# Patient Record
Sex: Male | Born: 1955
Health system: Southern US, Community
[De-identification: ages and names within clinical notes are randomized; demographics above are authoritative.]

---

## 2003-01-08 ENCOUNTER — Encounter: Admission: RE | Admit: 2003-01-08 | Discharge: 2003-01-08 | Payer: Self-pay | Admitting: Neurosurgery

## 2003-02-08 ENCOUNTER — Ambulatory Visit (HOSPITAL_COMMUNITY): Admission: RE | Admit: 2003-02-08 | Discharge: 2003-02-08 | Payer: Self-pay | Admitting: Neurosurgery

## 2003-03-12 ENCOUNTER — Ambulatory Visit (HOSPITAL_COMMUNITY): Admission: RE | Admit: 2003-03-12 | Discharge: 2003-03-12 | Payer: Self-pay | Admitting: Neurosurgery

## 2003-07-18 ENCOUNTER — Ambulatory Visit (HOSPITAL_COMMUNITY): Admission: RE | Admit: 2003-07-18 | Discharge: 2003-07-19 | Payer: Self-pay | Admitting: Neurosurgery

## 2003-11-26 ENCOUNTER — Encounter: Admission: RE | Admit: 2003-11-26 | Discharge: 2003-11-26 | Payer: Self-pay | Admitting: Neurosurgery

## 2003-12-25 ENCOUNTER — Encounter: Admission: RE | Admit: 2003-12-25 | Discharge: 2003-12-25 | Payer: Self-pay | Admitting: Neurosurgery

## 2004-01-16 ENCOUNTER — Encounter: Admission: RE | Admit: 2004-01-16 | Discharge: 2004-01-16 | Payer: Self-pay | Admitting: Neurosurgery

## 2004-04-23 ENCOUNTER — Encounter: Admission: RE | Admit: 2004-04-23 | Discharge: 2004-04-23 | Payer: Self-pay | Admitting: Neurosurgery

## 2004-04-27 ENCOUNTER — Encounter: Admission: RE | Admit: 2004-04-27 | Discharge: 2004-04-27 | Payer: Self-pay | Admitting: Neurosurgery

## 2004-05-27 ENCOUNTER — Encounter: Admission: RE | Admit: 2004-05-27 | Discharge: 2004-05-27 | Payer: Self-pay | Admitting: Neurosurgery

## 2004-06-30 ENCOUNTER — Inpatient Hospital Stay (HOSPITAL_COMMUNITY): Admission: RE | Admit: 2004-06-30 | Discharge: 2004-07-07 | Payer: Self-pay | Admitting: Neurosurgery

## 2004-07-29 ENCOUNTER — Ambulatory Visit (HOSPITAL_COMMUNITY): Admission: RE | Admit: 2004-07-29 | Discharge: 2004-07-29 | Payer: Self-pay | Admitting: Neurosurgery

## 2005-04-08 ENCOUNTER — Encounter: Admission: RE | Admit: 2005-04-08 | Discharge: 2005-04-08 | Payer: Self-pay | Admitting: Neurosurgery

## 2006-05-03 ENCOUNTER — Ambulatory Visit: Payer: Self-pay | Admitting: Oncology

## 2006-05-17 ENCOUNTER — Encounter: Payer: Self-pay | Admitting: Infectious Disease

## 2006-05-17 ENCOUNTER — Ambulatory Visit: Payer: Self-pay | Admitting: Oncology

## 2006-12-22 ENCOUNTER — Encounter: Admission: RE | Admit: 2006-12-22 | Discharge: 2006-12-22 | Payer: Self-pay | Admitting: Neurosurgery

## 2007-03-06 ENCOUNTER — Ambulatory Visit: Payer: Self-pay | Admitting: Infectious Disease

## 2007-03-06 DIAGNOSIS — B370 Candidal stomatitis: Secondary | ICD-10-CM | POA: Insufficient documentation

## 2007-03-06 DIAGNOSIS — R1310 Dysphagia, unspecified: Secondary | ICD-10-CM | POA: Insufficient documentation

## 2007-03-06 DIAGNOSIS — Z9889 Other specified postprocedural states: Secondary | ICD-10-CM | POA: Insufficient documentation

## 2007-03-06 DIAGNOSIS — D72829 Elevated white blood cell count, unspecified: Secondary | ICD-10-CM | POA: Insufficient documentation

## 2007-03-06 LAB — CONVERTED CEMR LAB
ALT: 35 units/L (ref 0–53)
AST: 20 units/L (ref 0–37)
Alkaline Phosphatase: 95 units/L (ref 39–117)
BUN: 15 mg/dL (ref 6–23)
Basophils Absolute: 0.1 10*3/uL (ref 0.0–0.1)
Basophils Relative: 1 % (ref 0–1)
CRP: 0.6 mg/dL — ABNORMAL HIGH (ref <0.6)
Calcium: 10 mg/dL (ref 8.4–10.5)
Creatinine, Ser: 1.13 mg/dL (ref 0.40–1.50)
Eosinophils Absolute: 0.2 10*3/uL (ref 0.0–0.7)
Eosinophils Relative: 1 % (ref 0–5)
Ferritin: 365 ng/mL — ABNORMAL HIGH (ref 22–322)
Iron: 101 ug/dL (ref 42–165)
MCHC: 34.1 g/dL (ref 30.0–36.0)
MCV: 88.4 fL (ref 78.0–100.0)
Monocytes Relative: 5 % (ref 3–12)
Neutrophils Relative %: 69 % (ref 43–77)
Potassium: 4.6 meq/L (ref 3.5–5.3)
RDW: 12.9 % (ref 11.5–15.5)
TIBC: 358 ug/dL (ref 215–435)
TSH: 1.231 microintl units/mL (ref 0.350–5.50)
Total Bilirubin: 0.7 mg/dL (ref 0.3–1.2)
Vitamin B-12: 424 pg/mL (ref 211–911)

## 2007-03-13 ENCOUNTER — Telehealth: Payer: Self-pay

## 2007-05-08 ENCOUNTER — Ambulatory Visit: Payer: Self-pay | Admitting: Infectious Disease

## 2007-05-08 DIAGNOSIS — R221 Localized swelling, mass and lump, neck: Secondary | ICD-10-CM

## 2007-05-08 DIAGNOSIS — R220 Localized swelling, mass and lump, head: Secondary | ICD-10-CM

## 2007-07-25 ENCOUNTER — Encounter: Admission: RE | Admit: 2007-07-25 | Discharge: 2007-07-25 | Payer: Self-pay | Admitting: Neurosurgery

## 2009-11-16 IMAGING — CT CT L SPINE W/O CM
4 of 10 series · 12 of 33 positions shown, 14 images · non-contrast
Comparison: MRI 12/22/2006.

CLINICAL DATA: Back and bilateral leg pain

CT LUMBAR SPINE WITHOUT CONTRAST
TECHNIQUE: Multidetector CT imaging of the lumbar spine was
performed without intravenous contrast administration. Multiplanar
CT image reconstructions were also generated.

[Series 2: l-spine helical · axial · 0.27mm/px · z∈[+20,+97]mm · 2 of 94 slices shown, 3 images]
[im 32/94  soft-tissue]
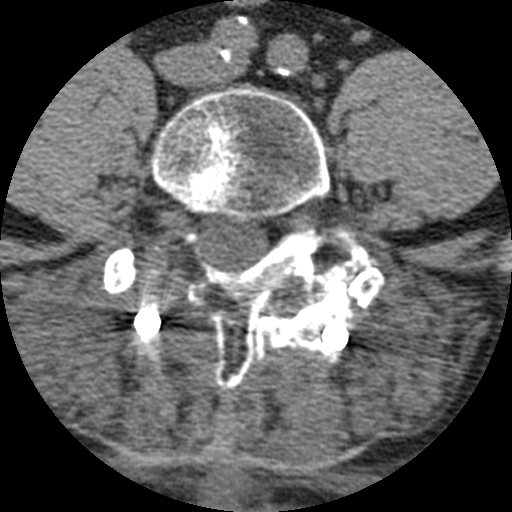
[im 32/94  bone]
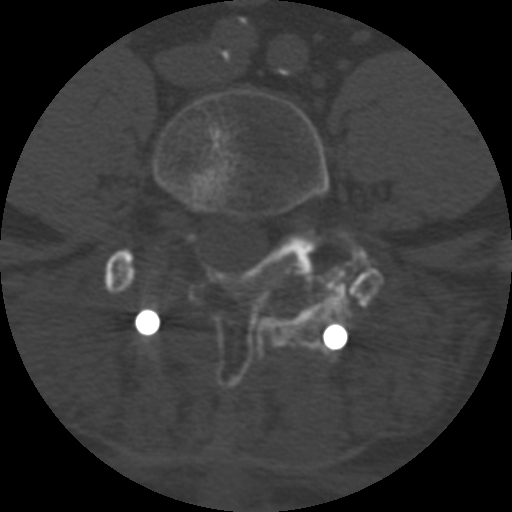
[im 63/94  bone]
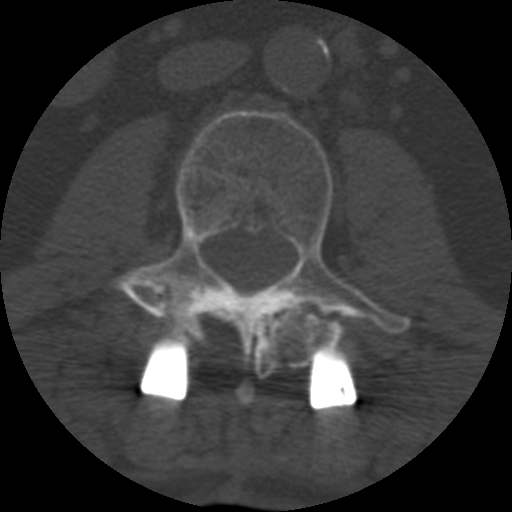

[Series 3: bone windows · axial · 0.27mm/px · z∈[+20,+97]mm · 2 of 94 slices shown]
[im 32/94  bone]
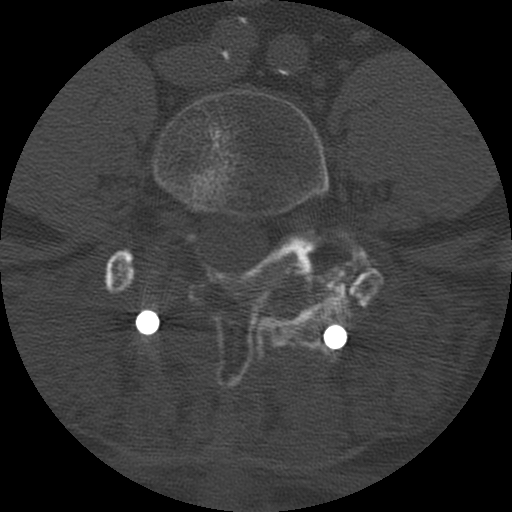
[im 63/94  bone]
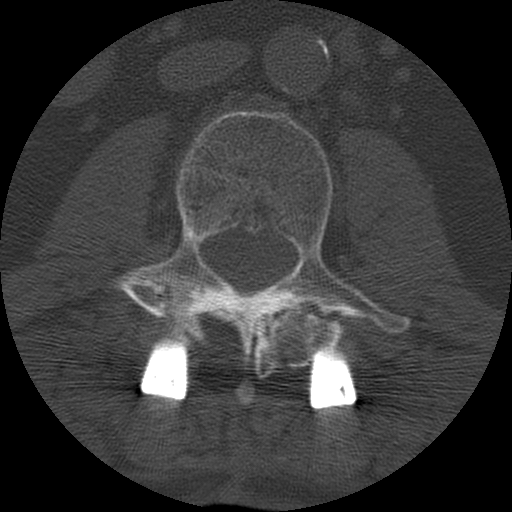

[Series 400: coronal · coronal · 0.46mm/px · 5 of 40 slices shown, 6 images]
[im 14/40  bone]
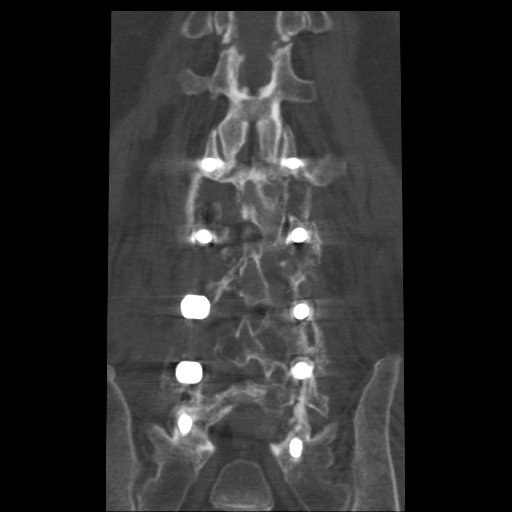
[im 17/40  bone]
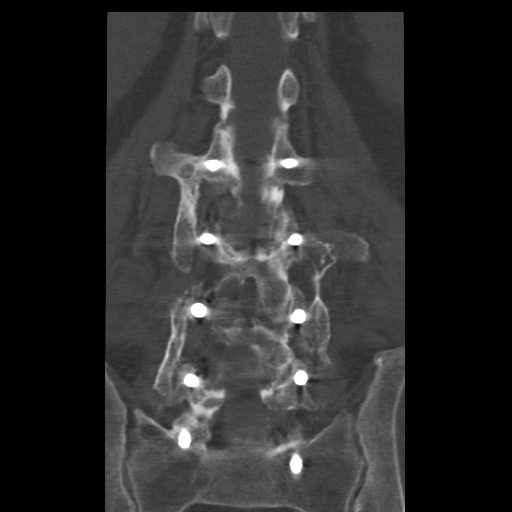
[im 20/40  soft-tissue]
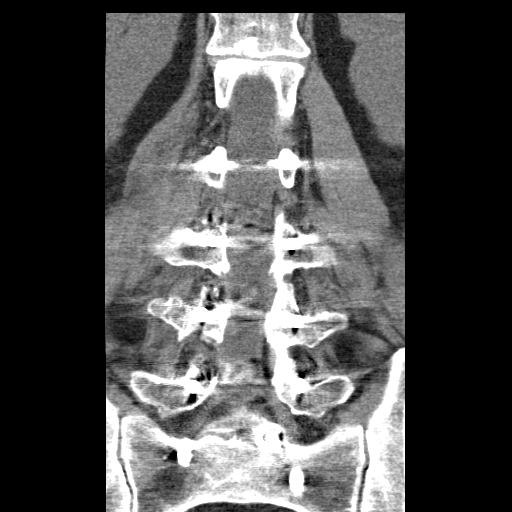
[im 20/40  bone]
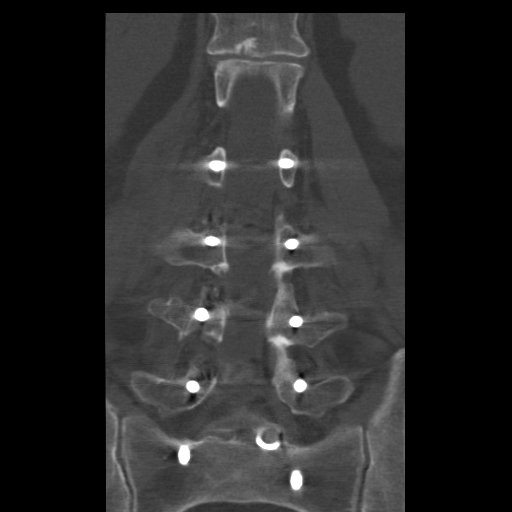
[im 23/40  bone]
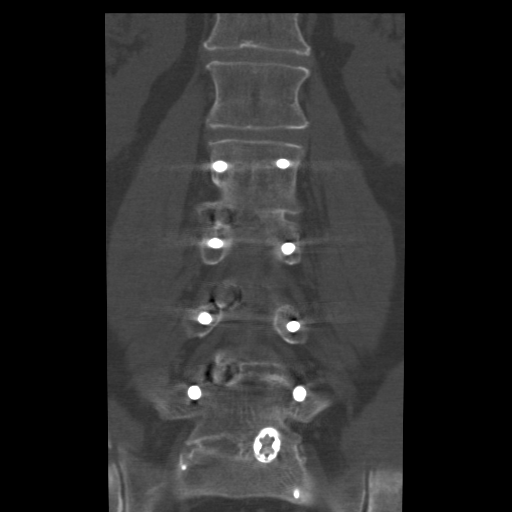
[im 27/40  bone]
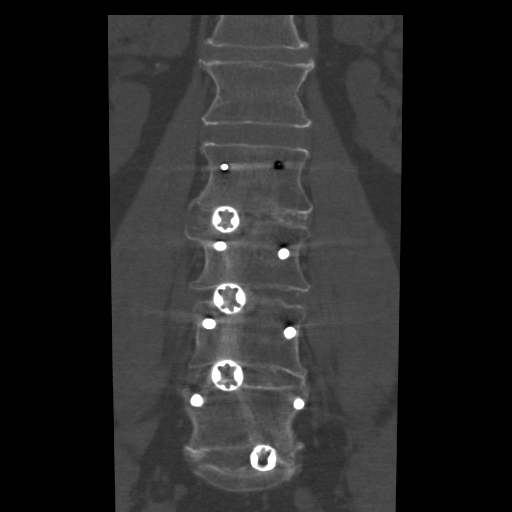

[Series 401: sagittal · sagittal · 0.46mm/px · 3 of 37 slices shown]
[im 8/37  bone]
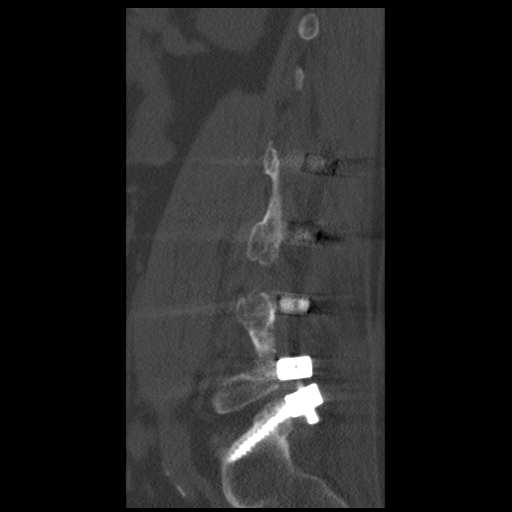
[im 15/37  bone]
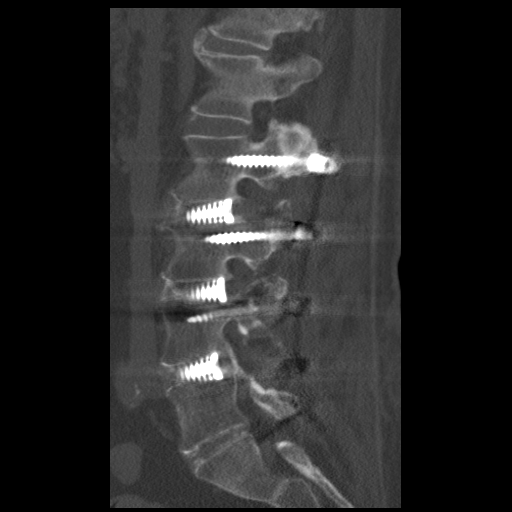
[im 22/37  bone]
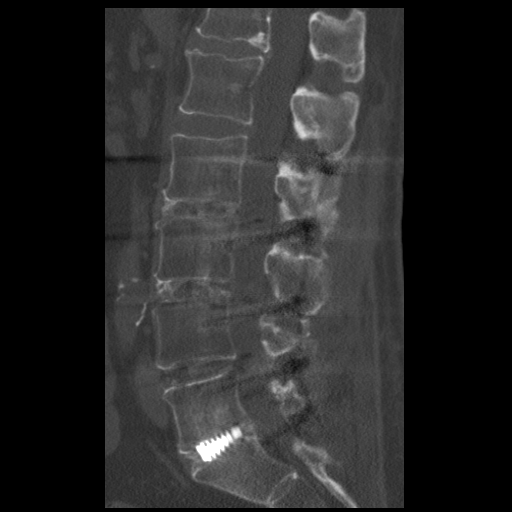

[12 of 33 positions shown; findings below may reference images not displayed]

FINDINGS: There is a solid fusion from L2-S1 with instrumentation
using pedicle screw and rod fixation as well as threaded interbody
cages anteriorly.  At L1-2 there is a shallow central and rightward
protrusion which potentially could irritate the right L2 nerve
root.  Posterior elements are fused, right more so than left, and
there is likely little movement at this level.

At T12-L1 there is a shallow calcific protrusion extending slightly
more to the right than the left.  It is unclear if this causes in
the L1 nerve root irritation.  There is no spinal stenosis.  These
areas can be seen fairly well on prior MRI and appear essentially
unchanged.  The only feature which is not well evaluated on MRI is
bony fusion, and this appears satisfactory on today's CT.

Hardware grossly intact.  No malposition or fractures seen. Mild
nonaneurysmal atherosclerotic calcification of the aorta is noted.
No adenopathy or masses can be seen
IMPRESSION: Shallow protrusions at T12-L1, and L1-2, as described.  Neither of
these appear compressive bone potentially the right L2 nerve root
could be irritated.

Compared with prior MR from December 2006 there is no significant
change.

Satisfactory appearance status post L2-S1 fusion

## 2010-02-28 ENCOUNTER — Encounter: Payer: Self-pay | Admitting: Neurosurgery

## 2010-03-01 ENCOUNTER — Encounter: Payer: Self-pay | Admitting: Neurosurgery

## 2010-06-26 NOTE — Op Note (Signed)
NAMEJUVENAL, UMAR                             ACCOUNT NO.:  [id number]   MEDICAL RECORD NO.:  [id number]                   PATIENT TYPE:  OIB   LOCATION:  3009                                 FACILITY:  [facility]   PHYSICIAN:  Reinaldo Meeker, M.D.              DATE OF BIRTH:  Oct 03, 1955   DATE OF PROCEDURE:  03/12/2003  DATE OF DISCHARGE:  03/12/2003                                 OPERATIVE REPORT   PREOPERATIVE DIAGNOSIS:  Herniated disc L2-L3 right.   POSTOPERATIVE DIAGNOSIS:  Herniated disc L2-L3 right.   PROCEDURE:  Right L2-L3 intralaminar laminotomy for excision of herniated  disc with operating microscope.   SECONDARY PROCEDURE:  Microdissection L2-L3 disc and L3 nerve root.   SURGEON:  Reinaldo Meeker, M.D.   ASSISTANT:  Kathaleen Maser. Pool, M.D.   PROCEDURE IN DETAIL:  After being placed in the prone position, the  patient's back was prepped and draped in the usual sterile fashion.  Localizing x-ray was taken prior to incision to identify the appropriate  level.  A midline incision was made above the spinous processes of L2 and  L3.  Using Bovie cutting current, the incision was carried to the spinous  processes.  Subperiosteal dissection was then carried out along the spinous  processes and lamina.  Self-retaining retractor was placed for exposure.  A  second x-ray showed we approached the appropriate level.  Using the high  speed drill, the inferior 1/2 of the L2 lamina and the medial 1/3 of the  facet joint was removed.  The drill was then used to remove the superior 1/2  of the L3 lamina.  Residual bone and ligamentum flavum was removed in a  piecemeal fashion.  The microscope was draped, brought into the field, and  used for the remainder of the case.  Using microdissection technique, the  lateral aspect of the thecal sac and L3 nerve root were identified.  Further  coagulation was carried down to the floor of the canal to identify the L2-L3  disc which was found to be  herniated in the lateral aspect of the spinal  canal.  After coagulating on the annulus, the annulus was incised with a 15  blade.  Using pituitary rongeurs and curets, thorough disc space clean out  was carried out.  Care was taken to avoid injury to the neural elements and  this was successfully done.  At this point, inspection was carried out in  all directions with no evidence of residual compression.  Large amounts of  irrigation were carried out, bleeding was controlled with bipolar  coagulation and Gelfoam.  The wound was then closed using interrupted Vicryl  in the muscle, fascia, subcutaneous and subcuticular tissue, and staples on  the skin.  Sterile dressings were then applied and the patient was extubated  and taken to the recovery room in stable condition.  Reinaldo Meeker, M.D.    ROK/MEDQ  D:  03/12/2003  T:  03/12/2003  Job:  [id number]

## 2010-06-26 NOTE — Op Note (Signed)
Derek Caldwell, Derek Caldwell                           ACCOUNT NO.:  [id number]   MEDICAL RECORD NO.:  [id number]                   PATIENT TYPE:  OIB   LOCATION:  3011                                 FACILITY:  [facility]   PHYSICIAN:  Reinaldo Meeker, M.D.              DATE OF BIRTH:  04-Jan-1956   DATE OF PROCEDURE:  07/18/2003  DATE OF DISCHARGE:                                 OPERATIVE REPORT   PREOPERATIVE DIAGNOSIS:  Herniated disk L2-3, right, recurrent.   POSTOPERATIVE DIAGNOSIS:  Herniated disk L2-3, right, recurrent.   PROCEDURE:  Right L2-3 re-do micro diskectomy.   SURGEON:  Reinaldo Meeker, M.D.   ASSISTANT:  Tia Alert, M.D.   DESCRIPTION OF PROCEDURE:  After being placed in the prone position, the  patient's back was prepped and draped in the usual sterile fashion.  A  localizing x-ray was taken prior to incision to identify the appropriate  level.  Previous lumbar incision was opened and both the spinous processes  of L2 and L3.  Using Bovie cutting current, the incision was carried up the  spinous processes.  Subperiosteal dissection was then carried out on the  right side of the spinous processes, lamina, and facet joints.  A self-  retaining retractor was placed for exposure.  __________  the appropriate  level.  Edges of the previous laminotomy were identified and the laminotomy  was enlarged.  Scar tissue on the thecal sac was removed.  L3 nerve root was  easily identified as it passed around the pedicle of L3.  At this time the  microscope was draped and brought into the field and used throughout the  remainder of the case.  Starting at the level of the pedicle, the nerve root  was dissected medially.  Dissection was then carried out superiorly towards  the L2-3 disk which was found markedly herniated.  After __________  incised  with a 15 blade.  Using pituitary rongeurs and curets, thorough disk space  clean-out was carried out.  Calcified fragment of disk  material was also  removed from the lateral aspect of the thecal sac.  Thorough disk space  clean-out was carried out.  At the same time great care was taken to avoid  injury to the __________  .  This was successfully done.  At this point  inspection was carried out in all directions without any evidence of  residual compression could be identified.  Large amounts of irrigation were  carried out and bleeding controlled by coagulation and Gelfoam.  The wound  was then closed using interrupted Vicryl in the muscle, fascia,  subcutaneous, subcuticular tissues, and staples on the skin.  A sterile  dressing was then applied and the patient was extubated and taken to the  recovery room in stable condition.  Reinaldo Meeker, M.D.    ROK/MEDQ  D:  07/18/2003  T:  07/18/2003  Job:  [id number]

## 2010-06-26 NOTE — H&P (Signed)
Derek Caldwell, Derek Caldwell                 ACCOUNT NO.:  [id number]   MEDICAL RECORD NO.:  [id number]          PATIENT TYPE:  INP   LOCATION:  2899                         FACILITY:  [facility]   PHYSICIAN:  Payton Doughty, M.D.      DATE OF BIRTH:  1956/01/15   DATE OF ADMISSION:  06/30/2004  DATE OF DISCHARGE:                                HISTORY & PHYSICAL   ADMITTING DIAGNOSIS:  Lumbar spondylosis.   HISTORY OF PRESENT ILLNESS:  This is a very nice 55 year old right-handed  white gentleman who one year ago February underwent a 2-3 diskectomy on the  right side by another neurosurgeon.  He had persistent pain and underwent a  redo operation in June.  He has had persistent pain in the right side.  He  was set up for epidurals.  MR in September demonstrated remaining disk  foramen with obstruction on the right side, and laterally protruding  osteophyte off the superior aspect of L3 that was encumbering the 2 or 3  root.  Considering an operation, managing conservatively, and then developed  the onset of left lower extremity pain.  Repeat MR approximately six weeks  ago demonstrated a disk on the left side at L5-S1, the site of a prior  operation in 1992, in addition to progression of disease at L4-5 on the  right side, spondylosis at 3-4, and bad spondylosis at 2-3 with obstruction  of the right-sided roots.  He is now admitted for decompression and fusion  of the right side at 2-3, 3-4, and 4-5, and on the left side 5-1 fusion  across those levels.   PAST MEDICAL HISTORY:  Remarkable for coronary artery disease.   MEDICATIONS:  1.  Zocor 20 mg daily.  2.  Norvasc 10 mg daily.  3.  Doxazosin 8 mg daily.  4.  Benazepril 20 mg daily.  5.  Folic acid 1 mg daily.  6.  Nexium 40 mg daily.  7.  Metoprolol 50 mg twice daily.  8.  Aspirin daily.  9.  Valium p.r.n.   ALLERGIES:  He has no allergies.   PAST SURGICAL HISTORY:  1.  Back operations as noted above.  2.  Knee operation in 1986.   SOCIAL HISTORY:  He smokes one pack of cigarettes a day.  He does not drink  alcohol.  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   FAMILY HISTORY:  Mom is 60.  Dad is 49.  Histories are not given.   REVIEW OF SYSTEMS:  Remarkable for hypertension, leg pain, difficulty with  urination, leg weakness, and back pain.   PHYSICAL EXAMINATION:  HEENT EXAM:  Within normal limits.  NECK:  He has reasonable range of motion in the neck.  CHEST:  Clear.  CARDIAC EXAM:  Regular rate and rhythm.  ABDOMEN:  Nontender with no hepatosplenomegaly.  EXTREMITIES:  Without clubbing or cyanosis.  Peripheral pulses are good.  GU EXAM:  Deferred.  NEUROLOGICAL:  He is awake, alert, and oriented.  His cranial nerves are  intact.  Motor exam shows 5/5 strength throughout  the upper and lower  extremities with weakness of the hip flexors on the right side and pain with  sitting.  He has pain in the L2 distribution on the right and L5  distribution on the right, and S1 distribution on the left.  Deep tendon  reflexes are 2 at the knees, absent at the left ankle, flicker at the right  ankle.  Straight leg is bilaterally positive on the right side for hip/groin  pain, on the left side positive for classic sciatic distribution down the  left leg.   STUDIES:  His studies have been reviewed as above, but basically has a MR  that shows significant spondylosis at L2-3, worse off to the right with  obstruction of the right L2 neural foramen.  L3-4 is fairly reasonable with  some degenerative change; 4-5 demonstrates a small disk eccentric to the  right side with elevation of the right L5 root, and L5-S1 demonstrates disk  recurrence off to the left side with elevation of the left L5 root as it  enters the foramen, in the left S1 root as it transverses the disk space.   CLINICAL IMPRESSION:  Multiple herniated disks and lumbar spondylosis in a  gentleman with left S1, right L5, and right L2 and 3  radiculopathies.   PLAN:  The plan is to go from the right side and decompress with  facetectomies at 2-3, 3-4, 4-5, remove the disk, place a single cage, go  from the left side at L5-S1, do a diskectomy, facetectomy, and  decompression, and then place a cage, and then pedicle screws at L2, L3, L4,  L5 and S1.  Hopefully, this will allow more surface area for bone grafting  posteriorly as well as posterolaterally.  The risks and benefits of this  approach has been discussed with him, and he wishes to proceed.      MWR/MEDQ  D:  06/30/2004  T:  06/30/2004  Job:  [id number]

## 2010-06-26 NOTE — Op Note (Signed)
NAMERANIER, COACH                 ACCOUNT NO.:  [id number]   MEDICAL RECORD NO.:  [id number]          PATIENT TYPE:  INP   LOCATION:  2899                         FACILITY:  [facility]   PHYSICIAN:  Payton Doughty, M.D.      DATE OF BIRTH:  04-09-1955   DATE OF PROCEDURE:  06/30/2004  DATE OF DISCHARGE:                                 OPERATIVE REPORT   PREOPERATIVE DIAGNOSIS:  Spondylosis, L4-5 with herniated disk on the left  side.  Recurrent herniated disk on the left at L5-S1.  Spondylosis at L2-3.   POSTOPERATIVE DIAGNOSIS:  Spondylosis, L4-5 with herniated disk on the left  side.  Recurrent herniated disk on the left at L5-S1.  Spondylosis at L2-3.  Absent inferior facet of L2 on the left.   OPERATION PERFORMED:  L2-3, L3-4, L4-5 right laminectomy, diskectomy,  posterior lumbar interbody fusion with Ray threaded fusion cages, L5-S1 left  laminectomy diskectomy, posterior lumbar interbody fusion with Ray threaded  fusion cage, segmental pedicle screw fixation from L2 to S1 and  posterolateral arthrodesis L2 to S1.   SURGEON:  Payton Doughty, M.D.   ANESTHESIA:  General endotracheal.   PREP:  Sterile Betadine prep and scrub with alcohol wipe.   COMPLICATIONS:  None.   NURSE ASSISTANT:  [place].   DOCTOR ASSISTANT:  Clydene Fake, M.D.   INDICATIONS FOR PROCEDURE:  The patient is a 55 year old gentleman with  severe spondylosis at 2-3, new disk at 4-5 and recurrent disk at 5-1.   DESCRIPTION OF PROCEDURE:  The patient was taken to the operating room,  smoothly anesthetized, intubated, and placed prone on the operating table.  Following shave, prep and drape in the usual sterile fashion, skin was  incised from mid-L1 to mid-S1.  The remaining laminae and transverse  processes of L2, L3, L4, L5 and the sacral ala were exposed bilaterally in  the subperiosteal plane.  Intraoperative x-ray confirmed correctness of the  level.  On the right side at L2, L3 and L4 the remaining  lamina, pars inter-  articularis and inferior facet was removed.  At L2 apparently during the  course of his last two operations, the inferior facet had been removed  leaving only a small amount of lamina and pars interarticularis.  This was  removed and the L2 root identified as it traversed the pedicle.  It was  carefully dissected free laterally out overtop of a large osteophyte that  protruded from the superior aspect of L3 and significantly compressed the  root.  The osteophyte was removed while protecting the nerve root.  The  nerve root was carefully inspected and although it was somewhat pale from  its chronic compression, appeared to be intact.  Diskectomy was then carried  out at 2-3.  At 3-4 and 4-5 similar procedures were carried out save that  there was no necessity to dissect the 3 or 4 roots as they traversed the  intertransverse region.  4-5 on the right there was a herniated disk that  significantly elevated the 5 root and that was removed.  Turning attention  to the left side, at 5-1 on the left side, the pars interarticularis, lamina  and inferior facet were removed and this allowed access to the lateral  aspect of the thecal sac and the L5 root as it traversed this area.  There  was a large recurrent disk significantly elevating the left S1 root. This  was removed without difficulty and resulted in immediate decompression of  the S1 root.  Ray threaded fusion cages were then placed at 2-3, 3-4, 4-5 on  the right and L5-S1 on the left.  There was 14 mm at 3-4 and 4-5, 12 mm at 2-  3 and 5-1.  Using the standard landmarks pedicle screws were then placed the  L2, L3, L4, L5 and S1 bilaterally.  Intraoperative x-ray showed good  placement of the screws.  The cages were packed with bone graft harvested  from the facet joints.  The rods were used to connect the screws and the  locking caps tightened upon them.  Final construct appeared good.  Bone  morphogenic protein on a  carrier was then placed in the intertransverse  region and on the left side at 2-3, 3-4 and 4-5 on the right side at 5-1,  DBX mixed with morcellized allograft was placed  as a graft.  Prior to this,  the wound was irrigated and hemostasis assured.  The fascia and subcutaneous  tissue was reapproximated with 0 Vicryl in interrupted fashion.  Subcuticular tissue was reapproximated with 3-0 Vicryl and the skin was  closed with 3-0 nylon in running locked fashion.  Betadine and Telfa  dressing was applied and made occlusive with OpSite and patient returned to  the recovery room in good condition.      MWR/MEDQ  D:  06/30/2004  T:  06/30/2004  Job:  [id number]

## 2010-06-26 NOTE — Discharge Summary (Signed)
[name], [name]                 ACCOUNT NO.:  [id number]   MEDICAL RECORD NO.:  [id number]          PATIENT TYPE:  INP   LOCATION:  3005                         FACILITY:  [facility]   PHYSICIAN:  Payton Doughty, M.D.      DATE OF BIRTH:  [birth year]   DATE OF ADMISSION:  06/30/2004  DATE OF DISCHARGE:  07/07/2004                                 DISCHARGE SUMMARY   ADMISSION DIAGNOSES:  Lumbar spondylosis at L2-3, L3-4, L4-5 and L5-S1.   DISCHARGE DIAGNOSES:  Lumbar spondylosis at L2-3, L3-4, L4-5 and L5-S1.   OPERATIVE PROCEDURE:  L2-S1 lumbar fusion.   COMPLICATIONS:  None.   DISCHARGE STATUS:  Well.   HISTORY OF PRESENT ILLNESS:  This is a 55 year old right handed white  gentleman.  His history and physical is recounted on the chart.  He has had  several other back operations at L2-3 on the right, as well as, a 5-1 disk  on the left.  He has right leg and hip pain and MR events nerve root  compression.  He was admitted after I obtained normal laboratory values and  underwent fusion from L2-S1.  Postoperatively, he has done reasonably well.  He was in the ICU for a couple of days.  He is up with his brace, eating and  voiding normally now.  During the course of his hospitalization, he did have  some swelling of his incision.  It was determined to be a superficial  hematoma.  It was not re-explored.  Because BMP was used for fusion, we did  not wish to loose that graft.  He is afebrile, eating and voiding normally.  Strength is full.  Incision is now dry with some minimal amount of swelling.  He has been discharged home in the care of his family with follow up to be  in the [facility] office in a week for suture  removal.      MWR/MEDQ  D:  07/07/2004  T:  07/07/2004  Job:  [id number]

## 2015-02-27 DIAGNOSIS — R609 Edema, unspecified: Secondary | ICD-10-CM | POA: Diagnosis not present

## 2015-02-27 DIAGNOSIS — I10 Essential (primary) hypertension: Secondary | ICD-10-CM | POA: Diagnosis not present

## 2015-02-27 DIAGNOSIS — M545 Low back pain: Secondary | ICD-10-CM | POA: Diagnosis not present

## 2015-02-27 DIAGNOSIS — I2510 Atherosclerotic heart disease of native coronary artery without angina pectoris: Secondary | ICD-10-CM | POA: Diagnosis not present

## 2015-02-27 DIAGNOSIS — E782 Mixed hyperlipidemia: Secondary | ICD-10-CM | POA: Diagnosis not present

## 2015-03-18 DIAGNOSIS — L720 Epidermal cyst: Secondary | ICD-10-CM | POA: Diagnosis not present

## 2015-03-31 DIAGNOSIS — I10 Essential (primary) hypertension: Secondary | ICD-10-CM | POA: Diagnosis not present

## 2015-03-31 DIAGNOSIS — M545 Low back pain: Secondary | ICD-10-CM | POA: Diagnosis not present

## 2015-03-31 DIAGNOSIS — R739 Hyperglycemia, unspecified: Secondary | ICD-10-CM | POA: Diagnosis not present

## 2015-03-31 DIAGNOSIS — E782 Mixed hyperlipidemia: Secondary | ICD-10-CM | POA: Diagnosis not present

## 2015-03-31 DIAGNOSIS — Z125 Encounter for screening for malignant neoplasm of prostate: Secondary | ICD-10-CM | POA: Diagnosis not present

## 2015-03-31 DIAGNOSIS — I2510 Atherosclerotic heart disease of native coronary artery without angina pectoris: Secondary | ICD-10-CM | POA: Diagnosis not present

## 2015-03-31 DIAGNOSIS — R609 Edema, unspecified: Secondary | ICD-10-CM | POA: Diagnosis not present

## 2015-04-01 DIAGNOSIS — L720 Epidermal cyst: Secondary | ICD-10-CM | POA: Diagnosis not present

## 2015-04-28 DIAGNOSIS — Z6826 Body mass index (BMI) 26.0-26.9, adult: Secondary | ICD-10-CM | POA: Diagnosis not present

## 2015-04-28 DIAGNOSIS — E782 Mixed hyperlipidemia: Secondary | ICD-10-CM | POA: Diagnosis not present

## 2015-04-28 DIAGNOSIS — E663 Overweight: Secondary | ICD-10-CM | POA: Diagnosis not present

## 2015-04-28 DIAGNOSIS — R609 Edema, unspecified: Secondary | ICD-10-CM | POA: Diagnosis not present

## 2015-04-28 DIAGNOSIS — F419 Anxiety disorder, unspecified: Secondary | ICD-10-CM | POA: Diagnosis not present

## 2015-04-28 DIAGNOSIS — I2510 Atherosclerotic heart disease of native coronary artery without angina pectoris: Secondary | ICD-10-CM | POA: Diagnosis not present

## 2015-04-28 DIAGNOSIS — I10 Essential (primary) hypertension: Secondary | ICD-10-CM | POA: Diagnosis not present

## 2015-04-28 DIAGNOSIS — K5900 Constipation, unspecified: Secondary | ICD-10-CM | POA: Diagnosis not present

## 2015-04-28 DIAGNOSIS — M545 Low back pain: Secondary | ICD-10-CM | POA: Diagnosis not present

## 2015-05-29 DIAGNOSIS — K210 Gastro-esophageal reflux disease with esophagitis: Secondary | ICD-10-CM | POA: Diagnosis not present

## 2015-05-29 DIAGNOSIS — M545 Low back pain: Secondary | ICD-10-CM | POA: Diagnosis not present

## 2015-05-29 DIAGNOSIS — R609 Edema, unspecified: Secondary | ICD-10-CM | POA: Diagnosis not present

## 2015-05-29 DIAGNOSIS — I2510 Atherosclerotic heart disease of native coronary artery without angina pectoris: Secondary | ICD-10-CM | POA: Diagnosis not present

## 2015-07-01 DIAGNOSIS — I2510 Atherosclerotic heart disease of native coronary artery without angina pectoris: Secondary | ICD-10-CM | POA: Diagnosis not present

## 2015-07-01 DIAGNOSIS — K210 Gastro-esophageal reflux disease with esophagitis: Secondary | ICD-10-CM | POA: Diagnosis not present

## 2015-07-01 DIAGNOSIS — I10 Essential (primary) hypertension: Secondary | ICD-10-CM | POA: Diagnosis not present

## 2015-07-01 DIAGNOSIS — Z6826 Body mass index (BMI) 26.0-26.9, adult: Secondary | ICD-10-CM | POA: Diagnosis not present

## 2015-07-01 DIAGNOSIS — E782 Mixed hyperlipidemia: Secondary | ICD-10-CM | POA: Diagnosis not present

## 2015-07-01 DIAGNOSIS — M545 Low back pain: Secondary | ICD-10-CM | POA: Diagnosis not present

## 2015-09-08 DIAGNOSIS — L03115 Cellulitis of right lower limb: Secondary | ICD-10-CM | POA: Diagnosis not present

## 2015-09-08 DIAGNOSIS — M79661 Pain in right lower leg: Secondary | ICD-10-CM | POA: Diagnosis not present

## 2015-09-08 DIAGNOSIS — Z6825 Body mass index (BMI) 25.0-25.9, adult: Secondary | ICD-10-CM | POA: Diagnosis not present

## 2015-09-08 DIAGNOSIS — M7989 Other specified soft tissue disorders: Secondary | ICD-10-CM | POA: Diagnosis not present

## 2015-09-09 DIAGNOSIS — M7989 Other specified soft tissue disorders: Secondary | ICD-10-CM | POA: Diagnosis not present

## 2015-09-09 DIAGNOSIS — M79604 Pain in right leg: Secondary | ICD-10-CM | POA: Diagnosis not present

## 2015-09-16 DIAGNOSIS — S8011XA Contusion of right lower leg, initial encounter: Secondary | ICD-10-CM | POA: Diagnosis not present

## 2015-09-16 DIAGNOSIS — Z5320 Procedure and treatment not carried out because of patient's decision for unspecified reasons: Secondary | ICD-10-CM | POA: Diagnosis not present

## 2015-09-16 DIAGNOSIS — M79606 Pain in leg, unspecified: Secondary | ICD-10-CM | POA: Diagnosis not present

## 2015-09-16 DIAGNOSIS — X58XXXA Exposure to other specified factors, initial encounter: Secondary | ICD-10-CM | POA: Diagnosis not present

## 2015-09-16 DIAGNOSIS — M25461 Effusion, right knee: Secondary | ICD-10-CM | POA: Diagnosis not present

## 2015-09-16 DIAGNOSIS — R600 Localized edema: Secondary | ICD-10-CM | POA: Diagnosis not present

## 2015-09-17 DIAGNOSIS — S8011XA Contusion of right lower leg, initial encounter: Secondary | ICD-10-CM | POA: Diagnosis not present

## 2015-09-17 DIAGNOSIS — R600 Localized edema: Secondary | ICD-10-CM | POA: Diagnosis not present

## 2015-10-02 DIAGNOSIS — R609 Edema, unspecified: Secondary | ICD-10-CM | POA: Diagnosis not present

## 2015-10-02 DIAGNOSIS — K210 Gastro-esophageal reflux disease with esophagitis: Secondary | ICD-10-CM | POA: Diagnosis not present

## 2015-10-02 DIAGNOSIS — S8990XA Unspecified injury of unspecified lower leg, initial encounter: Secondary | ICD-10-CM | POA: Diagnosis not present

## 2015-10-02 DIAGNOSIS — I10 Essential (primary) hypertension: Secondary | ICD-10-CM | POA: Diagnosis not present

## 2015-10-02 DIAGNOSIS — I2510 Atherosclerotic heart disease of native coronary artery without angina pectoris: Secondary | ICD-10-CM | POA: Diagnosis not present

## 2015-10-02 DIAGNOSIS — E785 Hyperlipidemia, unspecified: Secondary | ICD-10-CM | POA: Diagnosis not present

## 2015-10-02 DIAGNOSIS — M5416 Radiculopathy, lumbar region: Secondary | ICD-10-CM | POA: Diagnosis not present

## 2015-10-09 DIAGNOSIS — M79661 Pain in right lower leg: Secondary | ICD-10-CM | POA: Diagnosis not present

## 2015-10-22 DIAGNOSIS — M79661 Pain in right lower leg: Secondary | ICD-10-CM | POA: Diagnosis not present

## 2015-10-23 DIAGNOSIS — M79661 Pain in right lower leg: Secondary | ICD-10-CM | POA: Diagnosis not present

## 2015-11-03 DIAGNOSIS — K210 Gastro-esophageal reflux disease with esophagitis: Secondary | ICD-10-CM | POA: Diagnosis not present

## 2015-11-03 DIAGNOSIS — E782 Mixed hyperlipidemia: Secondary | ICD-10-CM | POA: Diagnosis not present

## 2015-11-03 DIAGNOSIS — I2510 Atherosclerotic heart disease of native coronary artery without angina pectoris: Secondary | ICD-10-CM | POA: Diagnosis not present

## 2015-11-03 DIAGNOSIS — M545 Low back pain: Secondary | ICD-10-CM | POA: Diagnosis not present

## 2015-11-03 DIAGNOSIS — E538 Deficiency of other specified B group vitamins: Secondary | ICD-10-CM | POA: Diagnosis not present

## 2015-11-03 DIAGNOSIS — I10 Essential (primary) hypertension: Secondary | ICD-10-CM | POA: Diagnosis not present

## 2015-12-03 DIAGNOSIS — Z6825 Body mass index (BMI) 25.0-25.9, adult: Secondary | ICD-10-CM | POA: Diagnosis not present

## 2015-12-03 DIAGNOSIS — I10 Essential (primary) hypertension: Secondary | ICD-10-CM | POA: Diagnosis not present

## 2015-12-03 DIAGNOSIS — E663 Overweight: Secondary | ICD-10-CM | POA: Diagnosis not present

## 2015-12-03 DIAGNOSIS — E538 Deficiency of other specified B group vitamins: Secondary | ICD-10-CM | POA: Diagnosis not present

## 2015-12-03 DIAGNOSIS — Z23 Encounter for immunization: Secondary | ICD-10-CM | POA: Diagnosis not present

## 2015-12-03 DIAGNOSIS — M545 Low back pain: Secondary | ICD-10-CM | POA: Diagnosis not present

## 2016-01-05 DIAGNOSIS — Z6825 Body mass index (BMI) 25.0-25.9, adult: Secondary | ICD-10-CM | POA: Diagnosis not present

## 2016-01-05 DIAGNOSIS — I10 Essential (primary) hypertension: Secondary | ICD-10-CM | POA: Diagnosis not present

## 2016-01-05 DIAGNOSIS — M545 Low back pain: Secondary | ICD-10-CM | POA: Diagnosis not present

## 2016-01-05 DIAGNOSIS — K210 Gastro-esophageal reflux disease with esophagitis: Secondary | ICD-10-CM | POA: Diagnosis not present

## 2016-01-05 DIAGNOSIS — I2510 Atherosclerotic heart disease of native coronary artery without angina pectoris: Secondary | ICD-10-CM | POA: Diagnosis not present

## 2016-01-05 DIAGNOSIS — E785 Hyperlipidemia, unspecified: Secondary | ICD-10-CM | POA: Diagnosis not present

## 2016-02-04 DIAGNOSIS — K210 Gastro-esophageal reflux disease with esophagitis: Secondary | ICD-10-CM | POA: Diagnosis not present

## 2016-02-04 DIAGNOSIS — I2510 Atherosclerotic heart disease of native coronary artery without angina pectoris: Secondary | ICD-10-CM | POA: Diagnosis not present

## 2016-02-04 DIAGNOSIS — E663 Overweight: Secondary | ICD-10-CM | POA: Diagnosis not present

## 2016-02-04 DIAGNOSIS — M545 Low back pain: Secondary | ICD-10-CM | POA: Diagnosis not present

## 2016-02-04 DIAGNOSIS — E785 Hyperlipidemia, unspecified: Secondary | ICD-10-CM | POA: Diagnosis not present

## 2016-02-04 DIAGNOSIS — Z6826 Body mass index (BMI) 26.0-26.9, adult: Secondary | ICD-10-CM | POA: Diagnosis not present

## 2016-02-04 DIAGNOSIS — I10 Essential (primary) hypertension: Secondary | ICD-10-CM | POA: Diagnosis not present

## 2016-02-04 DIAGNOSIS — R609 Edema, unspecified: Secondary | ICD-10-CM | POA: Diagnosis not present

## 2016-03-08 DIAGNOSIS — K210 Gastro-esophageal reflux disease with esophagitis: Secondary | ICD-10-CM | POA: Diagnosis not present

## 2016-03-08 DIAGNOSIS — M542 Cervicalgia: Secondary | ICD-10-CM | POA: Diagnosis not present

## 2016-03-08 DIAGNOSIS — E538 Deficiency of other specified B group vitamins: Secondary | ICD-10-CM | POA: Diagnosis not present

## 2016-03-08 DIAGNOSIS — J301 Allergic rhinitis due to pollen: Secondary | ICD-10-CM | POA: Diagnosis not present

## 2016-03-08 DIAGNOSIS — I10 Essential (primary) hypertension: Secondary | ICD-10-CM | POA: Diagnosis not present

## 2016-03-08 DIAGNOSIS — R609 Edema, unspecified: Secondary | ICD-10-CM | POA: Diagnosis not present

## 2016-03-08 DIAGNOSIS — I2510 Atherosclerotic heart disease of native coronary artery without angina pectoris: Secondary | ICD-10-CM | POA: Diagnosis not present

## 2016-03-08 DIAGNOSIS — E785 Hyperlipidemia, unspecified: Secondary | ICD-10-CM | POA: Diagnosis not present

## 2016-04-08 DIAGNOSIS — E785 Hyperlipidemia, unspecified: Secondary | ICD-10-CM | POA: Diagnosis not present

## 2016-04-08 DIAGNOSIS — R609 Edema, unspecified: Secondary | ICD-10-CM | POA: Diagnosis not present

## 2016-04-08 DIAGNOSIS — K210 Gastro-esophageal reflux disease with esophagitis: Secondary | ICD-10-CM | POA: Diagnosis not present

## 2016-04-08 DIAGNOSIS — M545 Low back pain: Secondary | ICD-10-CM | POA: Diagnosis not present

## 2016-04-08 DIAGNOSIS — E538 Deficiency of other specified B group vitamins: Secondary | ICD-10-CM | POA: Diagnosis not present

## 2016-04-08 DIAGNOSIS — I10 Essential (primary) hypertension: Secondary | ICD-10-CM | POA: Diagnosis not present

## 2016-04-08 DIAGNOSIS — I2510 Atherosclerotic heart disease of native coronary artery without angina pectoris: Secondary | ICD-10-CM | POA: Diagnosis not present

## 2016-04-08 DIAGNOSIS — J301 Allergic rhinitis due to pollen: Secondary | ICD-10-CM | POA: Diagnosis not present

## 2016-04-08 DIAGNOSIS — Z6825 Body mass index (BMI) 25.0-25.9, adult: Secondary | ICD-10-CM | POA: Diagnosis not present

## 2016-04-21 DIAGNOSIS — D126 Benign neoplasm of colon, unspecified: Secondary | ICD-10-CM | POA: Diagnosis not present

## 2016-04-21 DIAGNOSIS — Z01818 Encounter for other preprocedural examination: Secondary | ICD-10-CM | POA: Diagnosis not present

## 2016-04-30 DIAGNOSIS — K648 Other hemorrhoids: Secondary | ICD-10-CM | POA: Diagnosis not present

## 2016-04-30 DIAGNOSIS — D126 Benign neoplasm of colon, unspecified: Secondary | ICD-10-CM | POA: Diagnosis not present

## 2016-04-30 DIAGNOSIS — I10 Essential (primary) hypertension: Secondary | ICD-10-CM | POA: Diagnosis not present

## 2016-04-30 DIAGNOSIS — K5730 Diverticulosis of large intestine without perforation or abscess without bleeding: Secondary | ICD-10-CM | POA: Diagnosis not present

## 2016-04-30 DIAGNOSIS — F17200 Nicotine dependence, unspecified, uncomplicated: Secondary | ICD-10-CM | POA: Diagnosis not present

## 2016-04-30 DIAGNOSIS — Z86010 Personal history of colonic polyps: Secondary | ICD-10-CM | POA: Diagnosis not present

## 2016-04-30 DIAGNOSIS — Z79899 Other long term (current) drug therapy: Secondary | ICD-10-CM | POA: Diagnosis not present

## 2016-04-30 DIAGNOSIS — Z800 Family history of malignant neoplasm of digestive organs: Secondary | ICD-10-CM | POA: Diagnosis not present

## 2016-04-30 DIAGNOSIS — D122 Benign neoplasm of ascending colon: Secondary | ICD-10-CM | POA: Diagnosis not present

## 2016-04-30 DIAGNOSIS — Z9049 Acquired absence of other specified parts of digestive tract: Secondary | ICD-10-CM | POA: Diagnosis not present

## 2016-04-30 DIAGNOSIS — Z7982 Long term (current) use of aspirin: Secondary | ICD-10-CM | POA: Diagnosis not present

## 2016-04-30 DIAGNOSIS — D123 Benign neoplasm of transverse colon: Secondary | ICD-10-CM | POA: Diagnosis not present

## 2016-05-10 DIAGNOSIS — E785 Hyperlipidemia, unspecified: Secondary | ICD-10-CM | POA: Diagnosis not present

## 2016-05-10 DIAGNOSIS — R609 Edema, unspecified: Secondary | ICD-10-CM | POA: Diagnosis not present

## 2016-05-10 DIAGNOSIS — I2510 Atherosclerotic heart disease of native coronary artery without angina pectoris: Secondary | ICD-10-CM | POA: Diagnosis not present

## 2016-05-10 DIAGNOSIS — M545 Low back pain: Secondary | ICD-10-CM | POA: Diagnosis not present

## 2016-05-10 DIAGNOSIS — Z6824 Body mass index (BMI) 24.0-24.9, adult: Secondary | ICD-10-CM | POA: Diagnosis not present

## 2016-05-10 DIAGNOSIS — I10 Essential (primary) hypertension: Secondary | ICD-10-CM | POA: Diagnosis not present

## 2016-06-09 DIAGNOSIS — E291 Testicular hypofunction: Secondary | ICD-10-CM | POA: Diagnosis not present

## 2016-06-09 DIAGNOSIS — M545 Low back pain: Secondary | ICD-10-CM | POA: Diagnosis not present

## 2016-06-09 DIAGNOSIS — Z79899 Other long term (current) drug therapy: Secondary | ICD-10-CM | POA: Diagnosis not present

## 2016-06-09 DIAGNOSIS — Z125 Encounter for screening for malignant neoplasm of prostate: Secondary | ICD-10-CM | POA: Diagnosis not present

## 2016-06-09 DIAGNOSIS — I10 Essential (primary) hypertension: Secondary | ICD-10-CM | POA: Diagnosis not present

## 2016-06-09 DIAGNOSIS — E538 Deficiency of other specified B group vitamins: Secondary | ICD-10-CM | POA: Diagnosis not present

## 2016-06-09 DIAGNOSIS — I2510 Atherosclerotic heart disease of native coronary artery without angina pectoris: Secondary | ICD-10-CM | POA: Diagnosis not present

## 2016-06-09 DIAGNOSIS — R609 Edema, unspecified: Secondary | ICD-10-CM | POA: Diagnosis not present

## 2016-06-09 DIAGNOSIS — E785 Hyperlipidemia, unspecified: Secondary | ICD-10-CM | POA: Diagnosis not present

## 2016-06-09 DIAGNOSIS — M818 Other osteoporosis without current pathological fracture: Secondary | ICD-10-CM | POA: Diagnosis not present

## 2016-07-07 DIAGNOSIS — M79661 Pain in right lower leg: Secondary | ICD-10-CM | POA: Diagnosis not present

## 2016-07-12 DIAGNOSIS — I10 Essential (primary) hypertension: Secondary | ICD-10-CM | POA: Diagnosis not present

## 2016-07-12 DIAGNOSIS — K210 Gastro-esophageal reflux disease with esophagitis: Secondary | ICD-10-CM | POA: Diagnosis not present

## 2016-07-12 DIAGNOSIS — I2510 Atherosclerotic heart disease of native coronary artery without angina pectoris: Secondary | ICD-10-CM | POA: Diagnosis not present

## 2016-07-12 DIAGNOSIS — Z6824 Body mass index (BMI) 24.0-24.9, adult: Secondary | ICD-10-CM | POA: Diagnosis not present

## 2016-07-12 DIAGNOSIS — E785 Hyperlipidemia, unspecified: Secondary | ICD-10-CM | POA: Diagnosis not present

## 2016-07-12 DIAGNOSIS — M545 Low back pain: Secondary | ICD-10-CM | POA: Diagnosis not present

## 2016-08-12 DIAGNOSIS — K210 Gastro-esophageal reflux disease with esophagitis: Secondary | ICD-10-CM | POA: Diagnosis not present

## 2016-08-12 DIAGNOSIS — J301 Allergic rhinitis due to pollen: Secondary | ICD-10-CM | POA: Diagnosis not present

## 2016-08-12 DIAGNOSIS — I10 Essential (primary) hypertension: Secondary | ICD-10-CM | POA: Diagnosis not present

## 2016-08-12 DIAGNOSIS — M545 Low back pain: Secondary | ICD-10-CM | POA: Diagnosis not present

## 2016-08-12 DIAGNOSIS — R609 Edema, unspecified: Secondary | ICD-10-CM | POA: Diagnosis not present

## 2016-08-12 DIAGNOSIS — F419 Anxiety disorder, unspecified: Secondary | ICD-10-CM | POA: Diagnosis not present

## 2016-08-12 DIAGNOSIS — E785 Hyperlipidemia, unspecified: Secondary | ICD-10-CM | POA: Diagnosis not present

## 2016-08-12 DIAGNOSIS — I2510 Atherosclerotic heart disease of native coronary artery without angina pectoris: Secondary | ICD-10-CM | POA: Diagnosis not present

## 2016-08-12 DIAGNOSIS — K5900 Constipation, unspecified: Secondary | ICD-10-CM | POA: Diagnosis not present

## 2016-08-12 DIAGNOSIS — Z6824 Body mass index (BMI) 24.0-24.9, adult: Secondary | ICD-10-CM | POA: Diagnosis not present

## 2016-09-10 DIAGNOSIS — E785 Hyperlipidemia, unspecified: Secondary | ICD-10-CM | POA: Diagnosis not present

## 2016-09-10 DIAGNOSIS — I10 Essential (primary) hypertension: Secondary | ICD-10-CM | POA: Diagnosis not present

## 2016-09-10 DIAGNOSIS — M545 Low back pain: Secondary | ICD-10-CM | POA: Diagnosis not present

## 2016-09-10 DIAGNOSIS — I2510 Atherosclerotic heart disease of native coronary artery without angina pectoris: Secondary | ICD-10-CM | POA: Diagnosis not present

## 2016-10-14 DIAGNOSIS — M545 Low back pain: Secondary | ICD-10-CM | POA: Diagnosis not present

## 2016-10-14 DIAGNOSIS — I10 Essential (primary) hypertension: Secondary | ICD-10-CM | POA: Diagnosis not present

## 2016-10-14 DIAGNOSIS — Z6823 Body mass index (BMI) 23.0-23.9, adult: Secondary | ICD-10-CM | POA: Diagnosis not present

## 2016-10-14 DIAGNOSIS — I2510 Atherosclerotic heart disease of native coronary artery without angina pectoris: Secondary | ICD-10-CM | POA: Diagnosis not present

## 2016-10-14 DIAGNOSIS — K210 Gastro-esophageal reflux disease with esophagitis: Secondary | ICD-10-CM | POA: Diagnosis not present

## 2016-11-17 DIAGNOSIS — I2510 Atherosclerotic heart disease of native coronary artery without angina pectoris: Secondary | ICD-10-CM | POA: Diagnosis not present

## 2016-11-17 DIAGNOSIS — Z6824 Body mass index (BMI) 24.0-24.9, adult: Secondary | ICD-10-CM | POA: Diagnosis not present

## 2016-11-17 DIAGNOSIS — Z23 Encounter for immunization: Secondary | ICD-10-CM | POA: Diagnosis not present

## 2016-11-17 DIAGNOSIS — E785 Hyperlipidemia, unspecified: Secondary | ICD-10-CM | POA: Diagnosis not present

## 2016-11-17 DIAGNOSIS — M545 Low back pain: Secondary | ICD-10-CM | POA: Diagnosis not present

## 2016-11-17 DIAGNOSIS — I10 Essential (primary) hypertension: Secondary | ICD-10-CM | POA: Diagnosis not present

## 2016-12-20 DIAGNOSIS — Z6824 Body mass index (BMI) 24.0-24.9, adult: Secondary | ICD-10-CM | POA: Diagnosis not present

## 2016-12-20 DIAGNOSIS — M545 Low back pain: Secondary | ICD-10-CM | POA: Diagnosis not present

## 2016-12-20 DIAGNOSIS — I10 Essential (primary) hypertension: Secondary | ICD-10-CM | POA: Diagnosis not present

## 2016-12-20 DIAGNOSIS — I2510 Atherosclerotic heart disease of native coronary artery without angina pectoris: Secondary | ICD-10-CM | POA: Diagnosis not present

## 2016-12-20 DIAGNOSIS — E538 Deficiency of other specified B group vitamins: Secondary | ICD-10-CM | POA: Diagnosis not present

## 2016-12-20 DIAGNOSIS — E785 Hyperlipidemia, unspecified: Secondary | ICD-10-CM | POA: Diagnosis not present

## 2017-01-19 DIAGNOSIS — M545 Low back pain: Secondary | ICD-10-CM | POA: Diagnosis not present

## 2017-01-19 DIAGNOSIS — E538 Deficiency of other specified B group vitamins: Secondary | ICD-10-CM | POA: Diagnosis not present

## 2017-01-19 DIAGNOSIS — Z6824 Body mass index (BMI) 24.0-24.9, adult: Secondary | ICD-10-CM | POA: Diagnosis not present

## 2017-01-19 DIAGNOSIS — Z1331 Encounter for screening for depression: Secondary | ICD-10-CM | POA: Diagnosis not present

## 2017-01-19 DIAGNOSIS — I2510 Atherosclerotic heart disease of native coronary artery without angina pectoris: Secondary | ICD-10-CM | POA: Diagnosis not present

## 2017-01-19 DIAGNOSIS — I10 Essential (primary) hypertension: Secondary | ICD-10-CM | POA: Diagnosis not present

## 2017-01-19 DIAGNOSIS — E785 Hyperlipidemia, unspecified: Secondary | ICD-10-CM | POA: Diagnosis not present

## 2017-02-21 DIAGNOSIS — K210 Gastro-esophageal reflux disease with esophagitis: Secondary | ICD-10-CM | POA: Diagnosis not present

## 2017-02-21 DIAGNOSIS — Z6823 Body mass index (BMI) 23.0-23.9, adult: Secondary | ICD-10-CM | POA: Diagnosis not present

## 2017-02-21 DIAGNOSIS — F419 Anxiety disorder, unspecified: Secondary | ICD-10-CM | POA: Diagnosis not present

## 2017-02-21 DIAGNOSIS — R609 Edema, unspecified: Secondary | ICD-10-CM | POA: Diagnosis not present

## 2017-02-21 DIAGNOSIS — M545 Low back pain: Secondary | ICD-10-CM | POA: Diagnosis not present

## 2017-02-21 DIAGNOSIS — I10 Essential (primary) hypertension: Secondary | ICD-10-CM | POA: Diagnosis not present

## 2017-02-21 DIAGNOSIS — J301 Allergic rhinitis due to pollen: Secondary | ICD-10-CM | POA: Diagnosis not present

## 2017-02-21 DIAGNOSIS — E538 Deficiency of other specified B group vitamins: Secondary | ICD-10-CM | POA: Diagnosis not present

## 2017-02-21 DIAGNOSIS — E785 Hyperlipidemia, unspecified: Secondary | ICD-10-CM | POA: Diagnosis not present

## 2017-02-21 DIAGNOSIS — I2510 Atherosclerotic heart disease of native coronary artery without angina pectoris: Secondary | ICD-10-CM | POA: Diagnosis not present

## 2017-03-24 DIAGNOSIS — E785 Hyperlipidemia, unspecified: Secondary | ICD-10-CM | POA: Diagnosis not present

## 2017-03-24 DIAGNOSIS — M545 Low back pain: Secondary | ICD-10-CM | POA: Diagnosis not present

## 2017-03-24 DIAGNOSIS — J301 Allergic rhinitis due to pollen: Secondary | ICD-10-CM | POA: Diagnosis not present

## 2017-03-24 DIAGNOSIS — E538 Deficiency of other specified B group vitamins: Secondary | ICD-10-CM | POA: Diagnosis not present

## 2017-03-24 DIAGNOSIS — I10 Essential (primary) hypertension: Secondary | ICD-10-CM | POA: Diagnosis not present

## 2017-03-24 DIAGNOSIS — F419 Anxiety disorder, unspecified: Secondary | ICD-10-CM | POA: Diagnosis not present

## 2017-03-24 DIAGNOSIS — R609 Edema, unspecified: Secondary | ICD-10-CM | POA: Diagnosis not present

## 2017-03-24 DIAGNOSIS — I2510 Atherosclerotic heart disease of native coronary artery without angina pectoris: Secondary | ICD-10-CM | POA: Diagnosis not present

## 2017-03-24 DIAGNOSIS — Z6824 Body mass index (BMI) 24.0-24.9, adult: Secondary | ICD-10-CM | POA: Diagnosis not present

## 2017-03-24 DIAGNOSIS — K210 Gastro-esophageal reflux disease with esophagitis: Secondary | ICD-10-CM | POA: Diagnosis not present

## 2017-04-22 DIAGNOSIS — Z6824 Body mass index (BMI) 24.0-24.9, adult: Secondary | ICD-10-CM | POA: Diagnosis not present

## 2017-04-22 DIAGNOSIS — F419 Anxiety disorder, unspecified: Secondary | ICD-10-CM | POA: Diagnosis not present

## 2017-04-22 DIAGNOSIS — M545 Low back pain: Secondary | ICD-10-CM | POA: Diagnosis not present

## 2017-04-22 DIAGNOSIS — K210 Gastro-esophageal reflux disease with esophagitis: Secondary | ICD-10-CM | POA: Diagnosis not present

## 2017-04-22 DIAGNOSIS — I2510 Atherosclerotic heart disease of native coronary artery without angina pectoris: Secondary | ICD-10-CM | POA: Diagnosis not present

## 2017-04-22 DIAGNOSIS — I10 Essential (primary) hypertension: Secondary | ICD-10-CM | POA: Diagnosis not present

## 2017-04-22 DIAGNOSIS — E785 Hyperlipidemia, unspecified: Secondary | ICD-10-CM | POA: Diagnosis not present

## 2017-04-22 DIAGNOSIS — Z23 Encounter for immunization: Secondary | ICD-10-CM | POA: Diagnosis not present

## 2017-04-25 DIAGNOSIS — R300 Dysuria: Secondary | ICD-10-CM | POA: Diagnosis not present

## 2017-04-28 DIAGNOSIS — E785 Hyperlipidemia, unspecified: Secondary | ICD-10-CM | POA: Diagnosis not present

## 2017-05-23 DIAGNOSIS — R102 Pelvic and perineal pain: Secondary | ICD-10-CM | POA: Diagnosis not present

## 2017-05-23 DIAGNOSIS — Z6820 Body mass index (BMI) 20.0-20.9, adult: Secondary | ICD-10-CM | POA: Diagnosis not present

## 2017-05-23 DIAGNOSIS — M545 Low back pain: Secondary | ICD-10-CM | POA: Diagnosis not present

## 2017-05-23 DIAGNOSIS — M25552 Pain in left hip: Secondary | ICD-10-CM | POA: Diagnosis not present

## 2017-06-16 DIAGNOSIS — J301 Allergic rhinitis due to pollen: Secondary | ICD-10-CM | POA: Diagnosis not present

## 2017-06-16 DIAGNOSIS — J189 Pneumonia, unspecified organism: Secondary | ICD-10-CM | POA: Diagnosis not present

## 2017-06-16 DIAGNOSIS — Z6824 Body mass index (BMI) 24.0-24.9, adult: Secondary | ICD-10-CM | POA: Diagnosis not present

## 2017-06-16 DIAGNOSIS — M545 Low back pain: Secondary | ICD-10-CM | POA: Diagnosis not present

## 2017-06-16 DIAGNOSIS — J9801 Acute bronchospasm: Secondary | ICD-10-CM | POA: Diagnosis not present

## 2017-07-21 DIAGNOSIS — M545 Low back pain: Secondary | ICD-10-CM | POA: Diagnosis not present

## 2017-07-21 DIAGNOSIS — E538 Deficiency of other specified B group vitamins: Secondary | ICD-10-CM | POA: Diagnosis not present

## 2017-07-21 DIAGNOSIS — Z1339 Encounter for screening examination for other mental health and behavioral disorders: Secondary | ICD-10-CM | POA: Diagnosis not present

## 2017-07-21 DIAGNOSIS — J301 Allergic rhinitis due to pollen: Secondary | ICD-10-CM | POA: Diagnosis not present

## 2017-07-21 DIAGNOSIS — Z6824 Body mass index (BMI) 24.0-24.9, adult: Secondary | ICD-10-CM | POA: Diagnosis not present

## 2017-07-21 DIAGNOSIS — I2510 Atherosclerotic heart disease of native coronary artery without angina pectoris: Secondary | ICD-10-CM | POA: Diagnosis not present

## 2017-07-21 DIAGNOSIS — E782 Mixed hyperlipidemia: Secondary | ICD-10-CM | POA: Diagnosis not present

## 2017-07-21 DIAGNOSIS — K210 Gastro-esophageal reflux disease with esophagitis: Secondary | ICD-10-CM | POA: Diagnosis not present

## 2017-07-21 DIAGNOSIS — E785 Hyperlipidemia, unspecified: Secondary | ICD-10-CM | POA: Diagnosis not present

## 2017-07-21 DIAGNOSIS — R609 Edema, unspecified: Secondary | ICD-10-CM | POA: Diagnosis not present

## 2017-07-21 DIAGNOSIS — K5900 Constipation, unspecified: Secondary | ICD-10-CM | POA: Diagnosis not present

## 2017-08-10 DIAGNOSIS — R109 Unspecified abdominal pain: Secondary | ICD-10-CM | POA: Diagnosis not present

## 2017-08-10 DIAGNOSIS — R1011 Right upper quadrant pain: Secondary | ICD-10-CM | POA: Diagnosis not present

## 2017-08-10 DIAGNOSIS — K529 Noninfective gastroenteritis and colitis, unspecified: Secondary | ICD-10-CM | POA: Diagnosis not present

## 2017-08-10 DIAGNOSIS — F17210 Nicotine dependence, cigarettes, uncomplicated: Secondary | ICD-10-CM | POA: Diagnosis not present

## 2017-08-10 DIAGNOSIS — R1110 Vomiting, unspecified: Secondary | ICD-10-CM | POA: Diagnosis not present

## 2017-08-10 DIAGNOSIS — R1013 Epigastric pain: Secondary | ICD-10-CM | POA: Diagnosis not present

## 2017-08-22 DIAGNOSIS — E785 Hyperlipidemia, unspecified: Secondary | ICD-10-CM | POA: Diagnosis not present

## 2017-08-22 DIAGNOSIS — I2510 Atherosclerotic heart disease of native coronary artery without angina pectoris: Secondary | ICD-10-CM | POA: Diagnosis not present

## 2017-08-22 DIAGNOSIS — E538 Deficiency of other specified B group vitamins: Secondary | ICD-10-CM | POA: Diagnosis not present

## 2017-08-22 DIAGNOSIS — R609 Edema, unspecified: Secondary | ICD-10-CM | POA: Diagnosis not present

## 2017-08-22 DIAGNOSIS — I10 Essential (primary) hypertension: Secondary | ICD-10-CM | POA: Diagnosis not present

## 2017-08-22 DIAGNOSIS — K210 Gastro-esophageal reflux disease with esophagitis: Secondary | ICD-10-CM | POA: Diagnosis not present

## 2017-08-22 DIAGNOSIS — M545 Low back pain: Secondary | ICD-10-CM | POA: Diagnosis not present

## 2017-08-22 DIAGNOSIS — Z6822 Body mass index (BMI) 22.0-22.9, adult: Secondary | ICD-10-CM | POA: Diagnosis not present

## 2017-08-22 DIAGNOSIS — R197 Diarrhea, unspecified: Secondary | ICD-10-CM | POA: Diagnosis not present

## 2017-08-24 DIAGNOSIS — R197 Diarrhea, unspecified: Secondary | ICD-10-CM | POA: Diagnosis not present

## 2017-09-22 DIAGNOSIS — M545 Low back pain: Secondary | ICD-10-CM | POA: Diagnosis not present

## 2017-09-22 DIAGNOSIS — R609 Edema, unspecified: Secondary | ICD-10-CM | POA: Diagnosis not present

## 2017-09-22 DIAGNOSIS — E785 Hyperlipidemia, unspecified: Secondary | ICD-10-CM | POA: Diagnosis not present

## 2017-09-22 DIAGNOSIS — K210 Gastro-esophageal reflux disease with esophagitis: Secondary | ICD-10-CM | POA: Diagnosis not present

## 2017-09-22 DIAGNOSIS — Z6823 Body mass index (BMI) 23.0-23.9, adult: Secondary | ICD-10-CM | POA: Diagnosis not present

## 2017-09-22 DIAGNOSIS — R739 Hyperglycemia, unspecified: Secondary | ICD-10-CM | POA: Diagnosis not present

## 2017-09-22 DIAGNOSIS — I2510 Atherosclerotic heart disease of native coronary artery without angina pectoris: Secondary | ICD-10-CM | POA: Diagnosis not present

## 2017-09-22 DIAGNOSIS — Z125 Encounter for screening for malignant neoplasm of prostate: Secondary | ICD-10-CM | POA: Diagnosis not present

## 2017-10-24 DIAGNOSIS — Z6824 Body mass index (BMI) 24.0-24.9, adult: Secondary | ICD-10-CM | POA: Diagnosis not present

## 2017-10-24 DIAGNOSIS — M545 Low back pain: Secondary | ICD-10-CM | POA: Diagnosis not present

## 2017-10-24 DIAGNOSIS — E785 Hyperlipidemia, unspecified: Secondary | ICD-10-CM | POA: Diagnosis not present

## 2017-10-24 DIAGNOSIS — I2510 Atherosclerotic heart disease of native coronary artery without angina pectoris: Secondary | ICD-10-CM | POA: Diagnosis not present

## 2017-10-24 DIAGNOSIS — Z23 Encounter for immunization: Secondary | ICD-10-CM | POA: Diagnosis not present

## 2017-11-23 DIAGNOSIS — I10 Essential (primary) hypertension: Secondary | ICD-10-CM | POA: Diagnosis not present

## 2017-11-23 DIAGNOSIS — M545 Low back pain: Secondary | ICD-10-CM | POA: Diagnosis not present

## 2017-11-23 DIAGNOSIS — E785 Hyperlipidemia, unspecified: Secondary | ICD-10-CM | POA: Diagnosis not present

## 2017-11-23 DIAGNOSIS — N529 Male erectile dysfunction, unspecified: Secondary | ICD-10-CM | POA: Diagnosis not present

## 2017-11-23 DIAGNOSIS — Z792 Long term (current) use of antibiotics: Secondary | ICD-10-CM | POA: Diagnosis not present

## 2017-11-23 DIAGNOSIS — I2510 Atherosclerotic heart disease of native coronary artery without angina pectoris: Secondary | ICD-10-CM | POA: Diagnosis not present

## 2017-11-23 DIAGNOSIS — Z6824 Body mass index (BMI) 24.0-24.9, adult: Secondary | ICD-10-CM | POA: Diagnosis not present

## 2017-12-26 DIAGNOSIS — M545 Low back pain: Secondary | ICD-10-CM | POA: Diagnosis not present

## 2017-12-26 DIAGNOSIS — I2510 Atherosclerotic heart disease of native coronary artery without angina pectoris: Secondary | ICD-10-CM | POA: Diagnosis not present

## 2017-12-26 DIAGNOSIS — E785 Hyperlipidemia, unspecified: Secondary | ICD-10-CM | POA: Diagnosis not present

## 2017-12-26 DIAGNOSIS — Z6824 Body mass index (BMI) 24.0-24.9, adult: Secondary | ICD-10-CM | POA: Diagnosis not present

## 2017-12-26 DIAGNOSIS — K210 Gastro-esophageal reflux disease with esophagitis: Secondary | ICD-10-CM | POA: Diagnosis not present

## 2017-12-26 DIAGNOSIS — R609 Edema, unspecified: Secondary | ICD-10-CM | POA: Diagnosis not present

## 2018-01-04 DIAGNOSIS — R05 Cough: Secondary | ICD-10-CM | POA: Diagnosis not present

## 2018-01-04 DIAGNOSIS — J189 Pneumonia, unspecified organism: Secondary | ICD-10-CM | POA: Diagnosis not present

## 2018-01-04 DIAGNOSIS — J029 Acute pharyngitis, unspecified: Secondary | ICD-10-CM | POA: Diagnosis not present

## 2018-01-24 DIAGNOSIS — Z6824 Body mass index (BMI) 24.0-24.9, adult: Secondary | ICD-10-CM | POA: Diagnosis not present

## 2018-01-24 DIAGNOSIS — Z9181 History of falling: Secondary | ICD-10-CM | POA: Diagnosis not present

## 2018-01-24 DIAGNOSIS — J189 Pneumonia, unspecified organism: Secondary | ICD-10-CM | POA: Diagnosis not present

## 2018-01-24 DIAGNOSIS — M545 Low back pain: Secondary | ICD-10-CM | POA: Diagnosis not present

## 2018-01-24 DIAGNOSIS — Z1331 Encounter for screening for depression: Secondary | ICD-10-CM | POA: Diagnosis not present

## 2018-02-27 DIAGNOSIS — Z6824 Body mass index (BMI) 24.0-24.9, adult: Secondary | ICD-10-CM | POA: Diagnosis not present

## 2018-02-27 DIAGNOSIS — E785 Hyperlipidemia, unspecified: Secondary | ICD-10-CM | POA: Diagnosis not present

## 2018-02-27 DIAGNOSIS — R739 Hyperglycemia, unspecified: Secondary | ICD-10-CM | POA: Diagnosis not present

## 2018-02-27 DIAGNOSIS — M545 Low back pain: Secondary | ICD-10-CM | POA: Diagnosis not present

## 2018-02-27 DIAGNOSIS — Z79899 Other long term (current) drug therapy: Secondary | ICD-10-CM | POA: Diagnosis not present

## 2018-02-27 DIAGNOSIS — R609 Edema, unspecified: Secondary | ICD-10-CM | POA: Diagnosis not present

## 2018-03-30 DIAGNOSIS — M545 Low back pain: Secondary | ICD-10-CM | POA: Diagnosis not present

## 2018-03-30 DIAGNOSIS — E785 Hyperlipidemia, unspecified: Secondary | ICD-10-CM | POA: Diagnosis not present

## 2018-03-30 DIAGNOSIS — M5416 Radiculopathy, lumbar region: Secondary | ICD-10-CM | POA: Diagnosis not present

## 2018-03-30 DIAGNOSIS — K210 Gastro-esophageal reflux disease with esophagitis: Secondary | ICD-10-CM | POA: Diagnosis not present

## 2018-03-30 DIAGNOSIS — R609 Edema, unspecified: Secondary | ICD-10-CM | POA: Diagnosis not present

## 2018-03-30 DIAGNOSIS — Z6824 Body mass index (BMI) 24.0-24.9, adult: Secondary | ICD-10-CM | POA: Diagnosis not present

## 2018-03-30 DIAGNOSIS — I10 Essential (primary) hypertension: Secondary | ICD-10-CM | POA: Diagnosis not present

## 2018-04-12 DIAGNOSIS — M5416 Radiculopathy, lumbar region: Secondary | ICD-10-CM | POA: Diagnosis not present

## 2018-04-12 DIAGNOSIS — M545 Low back pain: Secondary | ICD-10-CM | POA: Diagnosis not present

## 2018-04-12 DIAGNOSIS — M5115 Intervertebral disc disorders with radiculopathy, thoracolumbar region: Secondary | ICD-10-CM | POA: Diagnosis not present

## 2018-04-12 DIAGNOSIS — M47815 Spondylosis without myelopathy or radiculopathy, thoracolumbar region: Secondary | ICD-10-CM | POA: Diagnosis not present

## 2018-04-27 DIAGNOSIS — R197 Diarrhea, unspecified: Secondary | ICD-10-CM | POA: Diagnosis not present

## 2018-04-27 DIAGNOSIS — M545 Low back pain: Secondary | ICD-10-CM | POA: Diagnosis not present

## 2018-04-27 DIAGNOSIS — Z6824 Body mass index (BMI) 24.0-24.9, adult: Secondary | ICD-10-CM | POA: Diagnosis not present

## 2018-05-02 DIAGNOSIS — R197 Diarrhea, unspecified: Secondary | ICD-10-CM | POA: Diagnosis not present

## 2018-05-23 DIAGNOSIS — M25562 Pain in left knee: Secondary | ICD-10-CM | POA: Diagnosis not present

## 2018-05-23 DIAGNOSIS — M545 Low back pain: Secondary | ICD-10-CM | POA: Diagnosis not present

## 2018-06-19 DIAGNOSIS — Z1159 Encounter for screening for other viral diseases: Secondary | ICD-10-CM | POA: Diagnosis not present

## 2018-06-19 DIAGNOSIS — I498 Other specified cardiac arrhythmias: Secondary | ICD-10-CM | POA: Diagnosis not present

## 2018-06-19 DIAGNOSIS — Z955 Presence of coronary angioplasty implant and graft: Secondary | ICD-10-CM | POA: Diagnosis not present

## 2018-06-19 DIAGNOSIS — I25110 Atherosclerotic heart disease of native coronary artery with unstable angina pectoris: Secondary | ICD-10-CM | POA: Diagnosis not present

## 2018-06-19 DIAGNOSIS — I442 Atrioventricular block, complete: Secondary | ICD-10-CM | POA: Diagnosis not present

## 2018-06-19 DIAGNOSIS — I10 Essential (primary) hypertension: Secondary | ICD-10-CM | POA: Diagnosis not present

## 2018-06-19 DIAGNOSIS — I517 Cardiomegaly: Secondary | ICD-10-CM | POA: Diagnosis not present

## 2018-06-19 DIAGNOSIS — Z79899 Other long term (current) drug therapy: Secondary | ICD-10-CM | POA: Diagnosis not present

## 2018-06-19 DIAGNOSIS — Z950 Presence of cardiac pacemaker: Secondary | ICD-10-CM | POA: Diagnosis not present

## 2018-06-19 DIAGNOSIS — J95821 Acute postprocedural respiratory failure: Secondary | ICD-10-CM | POA: Diagnosis not present

## 2018-06-19 DIAGNOSIS — M542 Cervicalgia: Secondary | ICD-10-CM | POA: Diagnosis not present

## 2018-06-19 DIAGNOSIS — Z9981 Dependence on supplemental oxygen: Secondary | ICD-10-CM | POA: Diagnosis not present

## 2018-06-19 DIAGNOSIS — I2582 Chronic total occlusion of coronary artery: Secondary | ICD-10-CM | POA: Diagnosis not present

## 2018-06-19 DIAGNOSIS — I110 Hypertensive heart disease with heart failure: Secondary | ICD-10-CM | POA: Diagnosis not present

## 2018-06-19 DIAGNOSIS — D72829 Elevated white blood cell count, unspecified: Secondary | ICD-10-CM | POA: Diagnosis not present

## 2018-06-19 DIAGNOSIS — I25118 Atherosclerotic heart disease of native coronary artery with other forms of angina pectoris: Secondary | ICD-10-CM | POA: Diagnosis not present

## 2018-06-19 DIAGNOSIS — I25119 Atherosclerotic heart disease of native coronary artery with unspecified angina pectoris: Secondary | ICD-10-CM | POA: Diagnosis not present

## 2018-06-19 DIAGNOSIS — R0602 Shortness of breath: Secondary | ICD-10-CM | POA: Diagnosis not present

## 2018-06-19 DIAGNOSIS — I444 Left anterior fascicular block: Secondary | ICD-10-CM | POA: Diagnosis not present

## 2018-06-19 DIAGNOSIS — I97790 Other intraoperative cardiac functional disturbances during cardiac surgery: Secondary | ICD-10-CM | POA: Diagnosis not present

## 2018-06-19 DIAGNOSIS — I4439 Other atrioventricular block: Secondary | ICD-10-CM | POA: Diagnosis not present

## 2018-06-19 DIAGNOSIS — I509 Heart failure, unspecified: Secondary | ICD-10-CM | POA: Diagnosis not present

## 2018-06-19 DIAGNOSIS — Z6825 Body mass index (BMI) 25.0-25.9, adult: Secondary | ICD-10-CM | POA: Diagnosis not present

## 2018-06-19 DIAGNOSIS — I2510 Atherosclerotic heart disease of native coronary artery without angina pectoris: Secondary | ICD-10-CM | POA: Diagnosis not present

## 2018-06-19 DIAGNOSIS — G8918 Other acute postprocedural pain: Secondary | ICD-10-CM | POA: Diagnosis not present

## 2018-06-19 DIAGNOSIS — R000 Tachycardia, unspecified: Secondary | ICD-10-CM | POA: Diagnosis not present

## 2018-06-19 DIAGNOSIS — I208 Other forms of angina pectoris: Secondary | ICD-10-CM | POA: Diagnosis not present

## 2018-06-19 DIAGNOSIS — G8929 Other chronic pain: Secondary | ICD-10-CM | POA: Diagnosis not present

## 2018-06-19 DIAGNOSIS — Z951 Presence of aortocoronary bypass graft: Secondary | ICD-10-CM | POA: Diagnosis not present

## 2018-06-19 DIAGNOSIS — J939 Pneumothorax, unspecified: Secondary | ICD-10-CM | POA: Diagnosis not present

## 2018-06-19 DIAGNOSIS — I5020 Unspecified systolic (congestive) heart failure: Secondary | ICD-10-CM | POA: Diagnosis not present

## 2018-06-19 DIAGNOSIS — M545 Low back pain: Secondary | ICD-10-CM | POA: Diagnosis not present

## 2018-06-19 DIAGNOSIS — M549 Dorsalgia, unspecified: Secondary | ICD-10-CM | POA: Diagnosis not present

## 2018-06-19 DIAGNOSIS — R079 Chest pain, unspecified: Secondary | ICD-10-CM | POA: Diagnosis not present

## 2018-06-19 DIAGNOSIS — Z7982 Long term (current) use of aspirin: Secondary | ICD-10-CM | POA: Diagnosis not present

## 2018-06-19 DIAGNOSIS — Z8249 Family history of ischemic heart disease and other diseases of the circulatory system: Secondary | ICD-10-CM | POA: Diagnosis not present

## 2018-06-19 DIAGNOSIS — I471 Supraventricular tachycardia: Secondary | ICD-10-CM | POA: Diagnosis not present

## 2018-06-19 DIAGNOSIS — I6523 Occlusion and stenosis of bilateral carotid arteries: Secondary | ICD-10-CM | POA: Diagnosis not present

## 2018-06-19 DIAGNOSIS — Z96651 Presence of right artificial knee joint: Secondary | ICD-10-CM | POA: Diagnosis not present

## 2018-06-19 DIAGNOSIS — D62 Acute posthemorrhagic anemia: Secondary | ICD-10-CM | POA: Diagnosis not present

## 2018-06-19 DIAGNOSIS — I25810 Atherosclerosis of coronary artery bypass graft(s) without angina pectoris: Secondary | ICD-10-CM | POA: Diagnosis not present

## 2018-06-19 DIAGNOSIS — R739 Hyperglycemia, unspecified: Secondary | ICD-10-CM | POA: Diagnosis not present

## 2018-06-19 DIAGNOSIS — E876 Hypokalemia: Secondary | ICD-10-CM | POA: Diagnosis not present

## 2018-06-19 DIAGNOSIS — E785 Hyperlipidemia, unspecified: Secondary | ICD-10-CM | POA: Diagnosis not present

## 2018-06-19 DIAGNOSIS — J9811 Atelectasis: Secondary | ICD-10-CM | POA: Diagnosis not present

## 2018-06-19 DIAGNOSIS — D696 Thrombocytopenia, unspecified: Secondary | ICD-10-CM | POA: Diagnosis not present

## 2018-06-19 DIAGNOSIS — R918 Other nonspecific abnormal finding of lung field: Secondary | ICD-10-CM | POA: Diagnosis not present

## 2018-06-19 DIAGNOSIS — F17210 Nicotine dependence, cigarettes, uncomplicated: Secondary | ICD-10-CM | POA: Diagnosis not present

## 2018-06-19 DIAGNOSIS — I97190 Other postprocedural cardiac functional disturbances following cardiac surgery: Secondary | ICD-10-CM | POA: Diagnosis not present

## 2018-06-19 DIAGNOSIS — Q245 Malformation of coronary vessels: Secondary | ICD-10-CM | POA: Diagnosis not present

## 2018-06-26 DIAGNOSIS — I442 Atrioventricular block, complete: Secondary | ICD-10-CM | POA: Diagnosis not present

## 2018-06-26 DIAGNOSIS — I471 Supraventricular tachycardia: Secondary | ICD-10-CM | POA: Diagnosis not present

## 2018-06-26 DIAGNOSIS — Z951 Presence of aortocoronary bypass graft: Secondary | ICD-10-CM | POA: Diagnosis not present

## 2018-06-29 DIAGNOSIS — I2510 Atherosclerotic heart disease of native coronary artery without angina pectoris: Secondary | ICD-10-CM | POA: Diagnosis not present

## 2018-06-29 DIAGNOSIS — Z950 Presence of cardiac pacemaker: Secondary | ICD-10-CM | POA: Diagnosis not present

## 2018-06-29 DIAGNOSIS — M545 Low back pain: Secondary | ICD-10-CM | POA: Diagnosis not present

## 2018-06-29 DIAGNOSIS — E785 Hyperlipidemia, unspecified: Secondary | ICD-10-CM | POA: Diagnosis not present

## 2018-06-29 DIAGNOSIS — R609 Edema, unspecified: Secondary | ICD-10-CM | POA: Diagnosis not present

## 2018-06-29 DIAGNOSIS — K210 Gastro-esophageal reflux disease with esophagitis: Secondary | ICD-10-CM | POA: Diagnosis not present

## 2018-07-13 DIAGNOSIS — I442 Atrioventricular block, complete: Secondary | ICD-10-CM | POA: Diagnosis not present

## 2018-07-13 DIAGNOSIS — Z45018 Encounter for adjustment and management of other part of cardiac pacemaker: Secondary | ICD-10-CM | POA: Diagnosis not present

## 2018-07-17 DIAGNOSIS — Z951 Presence of aortocoronary bypass graft: Secondary | ICD-10-CM | POA: Diagnosis not present

## 2018-07-17 DIAGNOSIS — Z950 Presence of cardiac pacemaker: Secondary | ICD-10-CM | POA: Diagnosis not present

## 2018-07-17 DIAGNOSIS — I2510 Atherosclerotic heart disease of native coronary artery without angina pectoris: Secondary | ICD-10-CM | POA: Diagnosis not present

## 2018-07-17 DIAGNOSIS — E785 Hyperlipidemia, unspecified: Secondary | ICD-10-CM | POA: Diagnosis not present

## 2018-07-17 DIAGNOSIS — I10 Essential (primary) hypertension: Secondary | ICD-10-CM | POA: Diagnosis not present

## 2018-07-31 DIAGNOSIS — M545 Low back pain: Secondary | ICD-10-CM | POA: Diagnosis not present

## 2018-07-31 DIAGNOSIS — I2510 Atherosclerotic heart disease of native coronary artery without angina pectoris: Secondary | ICD-10-CM | POA: Diagnosis not present

## 2018-07-31 DIAGNOSIS — Z950 Presence of cardiac pacemaker: Secondary | ICD-10-CM | POA: Diagnosis not present

## 2018-07-31 DIAGNOSIS — I10 Essential (primary) hypertension: Secondary | ICD-10-CM | POA: Diagnosis not present

## 2018-07-31 DIAGNOSIS — Z79899 Other long term (current) drug therapy: Secondary | ICD-10-CM | POA: Diagnosis not present

## 2018-07-31 DIAGNOSIS — K210 Gastro-esophageal reflux disease with esophagitis: Secondary | ICD-10-CM | POA: Diagnosis not present

## 2018-07-31 DIAGNOSIS — I441 Atrioventricular block, second degree: Secondary | ICD-10-CM | POA: Diagnosis not present

## 2018-07-31 DIAGNOSIS — E538 Deficiency of other specified B group vitamins: Secondary | ICD-10-CM | POA: Diagnosis not present

## 2018-07-31 DIAGNOSIS — E785 Hyperlipidemia, unspecified: Secondary | ICD-10-CM | POA: Diagnosis not present

## 2018-08-01 DIAGNOSIS — E785 Hyperlipidemia, unspecified: Secondary | ICD-10-CM | POA: Diagnosis not present

## 2018-08-01 DIAGNOSIS — R739 Hyperglycemia, unspecified: Secondary | ICD-10-CM | POA: Diagnosis not present

## 2018-08-22 DIAGNOSIS — I442 Atrioventricular block, complete: Secondary | ICD-10-CM | POA: Diagnosis not present

## 2018-08-22 DIAGNOSIS — Z45018 Encounter for adjustment and management of other part of cardiac pacemaker: Secondary | ICD-10-CM | POA: Diagnosis not present

## 2018-08-22 DIAGNOSIS — E785 Hyperlipidemia, unspecified: Secondary | ICD-10-CM | POA: Diagnosis not present

## 2018-08-22 DIAGNOSIS — I2510 Atherosclerotic heart disease of native coronary artery without angina pectoris: Secondary | ICD-10-CM | POA: Diagnosis not present

## 2018-08-22 DIAGNOSIS — I10 Essential (primary) hypertension: Secondary | ICD-10-CM | POA: Diagnosis not present

## 2018-08-22 DIAGNOSIS — Z951 Presence of aortocoronary bypass graft: Secondary | ICD-10-CM | POA: Diagnosis not present

## 2018-08-22 DIAGNOSIS — Z950 Presence of cardiac pacemaker: Secondary | ICD-10-CM | POA: Diagnosis not present

## 2018-08-31 DIAGNOSIS — Z950 Presence of cardiac pacemaker: Secondary | ICD-10-CM | POA: Diagnosis not present

## 2018-08-31 DIAGNOSIS — I10 Essential (primary) hypertension: Secondary | ICD-10-CM | POA: Diagnosis not present

## 2018-08-31 DIAGNOSIS — E782 Mixed hyperlipidemia: Secondary | ICD-10-CM | POA: Diagnosis not present

## 2018-08-31 DIAGNOSIS — I2510 Atherosclerotic heart disease of native coronary artery without angina pectoris: Secondary | ICD-10-CM | POA: Diagnosis not present

## 2018-08-31 DIAGNOSIS — E538 Deficiency of other specified B group vitamins: Secondary | ICD-10-CM | POA: Diagnosis not present

## 2018-08-31 DIAGNOSIS — M545 Low back pain: Secondary | ICD-10-CM | POA: Diagnosis not present

## 2018-09-11 DIAGNOSIS — I361 Nonrheumatic tricuspid (valve) insufficiency: Secondary | ICD-10-CM | POA: Diagnosis not present

## 2018-09-11 DIAGNOSIS — I2510 Atherosclerotic heart disease of native coronary artery without angina pectoris: Secondary | ICD-10-CM | POA: Diagnosis not present

## 2018-10-02 DIAGNOSIS — E538 Deficiency of other specified B group vitamins: Secondary | ICD-10-CM | POA: Diagnosis not present

## 2018-10-02 DIAGNOSIS — E782 Mixed hyperlipidemia: Secondary | ICD-10-CM | POA: Diagnosis not present

## 2018-10-02 DIAGNOSIS — Z950 Presence of cardiac pacemaker: Secondary | ICD-10-CM | POA: Diagnosis not present

## 2018-10-02 DIAGNOSIS — K210 Gastro-esophageal reflux disease with esophagitis: Secondary | ICD-10-CM | POA: Diagnosis not present

## 2018-10-02 DIAGNOSIS — M545 Low back pain: Secondary | ICD-10-CM | POA: Diagnosis not present

## 2018-10-02 DIAGNOSIS — I2510 Atherosclerotic heart disease of native coronary artery without angina pectoris: Secondary | ICD-10-CM | POA: Diagnosis not present

## 2018-11-02 DIAGNOSIS — E538 Deficiency of other specified B group vitamins: Secondary | ICD-10-CM | POA: Diagnosis not present

## 2018-11-02 DIAGNOSIS — E785 Hyperlipidemia, unspecified: Secondary | ICD-10-CM | POA: Diagnosis not present

## 2018-11-02 DIAGNOSIS — E782 Mixed hyperlipidemia: Secondary | ICD-10-CM | POA: Diagnosis not present

## 2018-11-02 DIAGNOSIS — Z950 Presence of cardiac pacemaker: Secondary | ICD-10-CM | POA: Diagnosis not present

## 2018-11-02 DIAGNOSIS — I2510 Atherosclerotic heart disease of native coronary artery without angina pectoris: Secondary | ICD-10-CM | POA: Diagnosis not present

## 2018-11-02 DIAGNOSIS — M545 Low back pain: Secondary | ICD-10-CM | POA: Diagnosis not present

## 2018-11-02 DIAGNOSIS — K210 Gastro-esophageal reflux disease with esophagitis: Secondary | ICD-10-CM | POA: Diagnosis not present

## 2018-11-06 DIAGNOSIS — D649 Anemia, unspecified: Secondary | ICD-10-CM | POA: Diagnosis not present

## 2018-11-06 DIAGNOSIS — E782 Mixed hyperlipidemia: Secondary | ICD-10-CM | POA: Diagnosis not present

## 2018-11-06 DIAGNOSIS — Z23 Encounter for immunization: Secondary | ICD-10-CM | POA: Diagnosis not present

## 2018-11-06 DIAGNOSIS — R739 Hyperglycemia, unspecified: Secondary | ICD-10-CM | POA: Diagnosis not present

## 2018-11-21 DIAGNOSIS — I442 Atrioventricular block, complete: Secondary | ICD-10-CM | POA: Diagnosis not present

## 2018-11-21 DIAGNOSIS — Z45018 Encounter for adjustment and management of other part of cardiac pacemaker: Secondary | ICD-10-CM | POA: Diagnosis not present

## 2018-12-01 DIAGNOSIS — K2100 Gastro-esophageal reflux disease with esophagitis, without bleeding: Secondary | ICD-10-CM | POA: Diagnosis not present

## 2018-12-01 DIAGNOSIS — I2510 Atherosclerotic heart disease of native coronary artery without angina pectoris: Secondary | ICD-10-CM | POA: Diagnosis not present

## 2018-12-01 DIAGNOSIS — M545 Low back pain: Secondary | ICD-10-CM | POA: Diagnosis not present

## 2018-12-01 DIAGNOSIS — E538 Deficiency of other specified B group vitamins: Secondary | ICD-10-CM | POA: Diagnosis not present

## 2018-12-01 DIAGNOSIS — Z950 Presence of cardiac pacemaker: Secondary | ICD-10-CM | POA: Diagnosis not present

## 2018-12-01 DIAGNOSIS — E782 Mixed hyperlipidemia: Secondary | ICD-10-CM | POA: Diagnosis not present

## 2018-12-18 DIAGNOSIS — K219 Gastro-esophageal reflux disease without esophagitis: Secondary | ICD-10-CM | POA: Diagnosis not present

## 2018-12-18 DIAGNOSIS — Z01818 Encounter for other preprocedural examination: Secondary | ICD-10-CM | POA: Diagnosis not present

## 2018-12-26 DIAGNOSIS — K3189 Other diseases of stomach and duodenum: Secondary | ICD-10-CM | POA: Diagnosis not present

## 2018-12-26 DIAGNOSIS — R1013 Epigastric pain: Secondary | ICD-10-CM | POA: Diagnosis not present

## 2018-12-26 DIAGNOSIS — K449 Diaphragmatic hernia without obstruction or gangrene: Secondary | ICD-10-CM | POA: Diagnosis not present

## 2018-12-26 DIAGNOSIS — K2970 Gastritis, unspecified, without bleeding: Secondary | ICD-10-CM | POA: Diagnosis not present

## 2018-12-26 DIAGNOSIS — R112 Nausea with vomiting, unspecified: Secondary | ICD-10-CM | POA: Diagnosis not present

## 2018-12-26 DIAGNOSIS — K222 Esophageal obstruction: Secondary | ICD-10-CM | POA: Diagnosis not present

## 2018-12-26 DIAGNOSIS — R1310 Dysphagia, unspecified: Secondary | ICD-10-CM | POA: Diagnosis not present

## 2019-01-02 DIAGNOSIS — Z950 Presence of cardiac pacemaker: Secondary | ICD-10-CM | POA: Diagnosis not present

## 2019-01-02 DIAGNOSIS — E782 Mixed hyperlipidemia: Secondary | ICD-10-CM | POA: Diagnosis not present

## 2019-01-02 DIAGNOSIS — I2510 Atherosclerotic heart disease of native coronary artery without angina pectoris: Secondary | ICD-10-CM | POA: Diagnosis not present

## 2019-01-02 DIAGNOSIS — M545 Low back pain: Secondary | ICD-10-CM | POA: Diagnosis not present

## 2019-01-02 DIAGNOSIS — E538 Deficiency of other specified B group vitamins: Secondary | ICD-10-CM | POA: Diagnosis not present

## 2019-01-02 DIAGNOSIS — K219 Gastro-esophageal reflux disease without esophagitis: Secondary | ICD-10-CM | POA: Diagnosis not present

## 2019-01-31 DIAGNOSIS — K219 Gastro-esophageal reflux disease without esophagitis: Secondary | ICD-10-CM | POA: Diagnosis not present

## 2019-01-31 DIAGNOSIS — Z950 Presence of cardiac pacemaker: Secondary | ICD-10-CM | POA: Diagnosis not present

## 2019-01-31 DIAGNOSIS — Z1331 Encounter for screening for depression: Secondary | ICD-10-CM | POA: Diagnosis not present

## 2019-01-31 DIAGNOSIS — E538 Deficiency of other specified B group vitamins: Secondary | ICD-10-CM | POA: Diagnosis not present

## 2019-01-31 DIAGNOSIS — I2510 Atherosclerotic heart disease of native coronary artery without angina pectoris: Secondary | ICD-10-CM | POA: Diagnosis not present

## 2019-01-31 DIAGNOSIS — Z9181 History of falling: Secondary | ICD-10-CM | POA: Diagnosis not present

## 2019-01-31 DIAGNOSIS — Z125 Encounter for screening for malignant neoplasm of prostate: Secondary | ICD-10-CM | POA: Diagnosis not present

## 2019-01-31 DIAGNOSIS — E782 Mixed hyperlipidemia: Secondary | ICD-10-CM | POA: Diagnosis not present

## 2019-01-31 DIAGNOSIS — M545 Low back pain: Secondary | ICD-10-CM | POA: Diagnosis not present

## 2019-02-05 DIAGNOSIS — E782 Mixed hyperlipidemia: Secondary | ICD-10-CM | POA: Diagnosis not present

## 2019-02-05 DIAGNOSIS — Z125 Encounter for screening for malignant neoplasm of prostate: Secondary | ICD-10-CM | POA: Diagnosis not present

## 2019-02-14 DIAGNOSIS — Z23 Encounter for immunization: Secondary | ICD-10-CM | POA: Diagnosis not present

## 2019-02-14 DIAGNOSIS — S61451A Open bite of right hand, initial encounter: Secondary | ICD-10-CM | POA: Diagnosis not present

## 2019-02-24 DIAGNOSIS — Z950 Presence of cardiac pacemaker: Secondary | ICD-10-CM | POA: Diagnosis not present

## 2019-03-05 DIAGNOSIS — Z950 Presence of cardiac pacemaker: Secondary | ICD-10-CM | POA: Diagnosis not present

## 2019-03-05 DIAGNOSIS — I2510 Atherosclerotic heart disease of native coronary artery without angina pectoris: Secondary | ICD-10-CM | POA: Diagnosis not present

## 2019-03-05 DIAGNOSIS — E782 Mixed hyperlipidemia: Secondary | ICD-10-CM | POA: Diagnosis not present

## 2019-03-05 DIAGNOSIS — M545 Low back pain: Secondary | ICD-10-CM | POA: Diagnosis not present

## 2019-03-05 DIAGNOSIS — K219 Gastro-esophageal reflux disease without esophagitis: Secondary | ICD-10-CM | POA: Diagnosis not present

## 2019-03-05 DIAGNOSIS — E538 Deficiency of other specified B group vitamins: Secondary | ICD-10-CM | POA: Diagnosis not present

## 2019-03-21 DIAGNOSIS — I10 Essential (primary) hypertension: Secondary | ICD-10-CM | POA: Diagnosis not present

## 2019-03-21 DIAGNOSIS — I2510 Atherosclerotic heart disease of native coronary artery without angina pectoris: Secondary | ICD-10-CM | POA: Diagnosis not present

## 2019-03-21 DIAGNOSIS — I442 Atrioventricular block, complete: Secondary | ICD-10-CM | POA: Diagnosis not present

## 2019-03-21 DIAGNOSIS — Z45018 Encounter for adjustment and management of other part of cardiac pacemaker: Secondary | ICD-10-CM | POA: Diagnosis not present

## 2019-03-21 DIAGNOSIS — Z951 Presence of aortocoronary bypass graft: Secondary | ICD-10-CM | POA: Diagnosis not present

## 2019-03-21 DIAGNOSIS — E785 Hyperlipidemia, unspecified: Secondary | ICD-10-CM | POA: Diagnosis not present

## 2019-04-05 DIAGNOSIS — K219 Gastro-esophageal reflux disease without esophagitis: Secondary | ICD-10-CM | POA: Diagnosis not present

## 2019-04-05 DIAGNOSIS — Z950 Presence of cardiac pacemaker: Secondary | ICD-10-CM | POA: Diagnosis not present

## 2019-04-05 DIAGNOSIS — M545 Low back pain: Secondary | ICD-10-CM | POA: Diagnosis not present

## 2019-04-05 DIAGNOSIS — E782 Mixed hyperlipidemia: Secondary | ICD-10-CM | POA: Diagnosis not present

## 2019-04-05 DIAGNOSIS — M5431 Sciatica, right side: Secondary | ICD-10-CM | POA: Diagnosis not present

## 2019-04-05 DIAGNOSIS — E538 Deficiency of other specified B group vitamins: Secondary | ICD-10-CM | POA: Diagnosis not present

## 2019-04-05 DIAGNOSIS — Z139 Encounter for screening, unspecified: Secondary | ICD-10-CM | POA: Diagnosis not present

## 2019-04-05 DIAGNOSIS — I2510 Atherosclerotic heart disease of native coronary artery without angina pectoris: Secondary | ICD-10-CM | POA: Diagnosis not present

## 2019-05-03 DIAGNOSIS — I10 Essential (primary) hypertension: Secondary | ICD-10-CM | POA: Diagnosis not present

## 2019-05-03 DIAGNOSIS — E782 Mixed hyperlipidemia: Secondary | ICD-10-CM | POA: Diagnosis not present

## 2019-05-03 DIAGNOSIS — M545 Low back pain: Secondary | ICD-10-CM | POA: Diagnosis not present

## 2019-05-03 DIAGNOSIS — K219 Gastro-esophageal reflux disease without esophagitis: Secondary | ICD-10-CM | POA: Diagnosis not present

## 2019-05-03 DIAGNOSIS — E538 Deficiency of other specified B group vitamins: Secondary | ICD-10-CM | POA: Diagnosis not present

## 2019-05-03 DIAGNOSIS — I2510 Atherosclerotic heart disease of native coronary artery without angina pectoris: Secondary | ICD-10-CM | POA: Diagnosis not present

## 2019-05-03 DIAGNOSIS — Z950 Presence of cardiac pacemaker: Secondary | ICD-10-CM | POA: Diagnosis not present

## 2019-05-21 DIAGNOSIS — K219 Gastro-esophageal reflux disease without esophagitis: Secondary | ICD-10-CM | POA: Diagnosis not present

## 2019-05-21 DIAGNOSIS — R110 Nausea: Secondary | ICD-10-CM | POA: Diagnosis not present

## 2019-05-30 DIAGNOSIS — Z950 Presence of cardiac pacemaker: Secondary | ICD-10-CM | POA: Diagnosis not present

## 2019-06-01 DIAGNOSIS — K3184 Gastroparesis: Secondary | ICD-10-CM | POA: Diagnosis not present

## 2019-06-04 DIAGNOSIS — K219 Gastro-esophageal reflux disease without esophagitis: Secondary | ICD-10-CM | POA: Diagnosis not present

## 2019-06-04 DIAGNOSIS — E538 Deficiency of other specified B group vitamins: Secondary | ICD-10-CM | POA: Diagnosis not present

## 2019-06-04 DIAGNOSIS — Z950 Presence of cardiac pacemaker: Secondary | ICD-10-CM | POA: Diagnosis not present

## 2019-06-04 DIAGNOSIS — M545 Low back pain: Secondary | ICD-10-CM | POA: Diagnosis not present

## 2019-06-04 DIAGNOSIS — I2510 Atherosclerotic heart disease of native coronary artery without angina pectoris: Secondary | ICD-10-CM | POA: Diagnosis not present

## 2019-06-04 DIAGNOSIS — I10 Essential (primary) hypertension: Secondary | ICD-10-CM | POA: Diagnosis not present

## 2019-06-04 DIAGNOSIS — E782 Mixed hyperlipidemia: Secondary | ICD-10-CM | POA: Diagnosis not present

## 2019-06-05 DIAGNOSIS — E785 Hyperlipidemia, unspecified: Secondary | ICD-10-CM | POA: Diagnosis not present

## 2019-06-05 DIAGNOSIS — I2510 Atherosclerotic heart disease of native coronary artery without angina pectoris: Secondary | ICD-10-CM | POA: Diagnosis not present

## 2019-06-05 DIAGNOSIS — Z951 Presence of aortocoronary bypass graft: Secondary | ICD-10-CM | POA: Diagnosis not present

## 2019-06-05 DIAGNOSIS — I442 Atrioventricular block, complete: Secondary | ICD-10-CM | POA: Diagnosis not present

## 2019-06-05 DIAGNOSIS — Z45018 Encounter for adjustment and management of other part of cardiac pacemaker: Secondary | ICD-10-CM | POA: Diagnosis not present

## 2019-06-05 DIAGNOSIS — I10 Essential (primary) hypertension: Secondary | ICD-10-CM | POA: Diagnosis not present

## 2019-06-05 DIAGNOSIS — Z950 Presence of cardiac pacemaker: Secondary | ICD-10-CM | POA: Diagnosis not present

## 2019-06-07 DIAGNOSIS — E782 Mixed hyperlipidemia: Secondary | ICD-10-CM | POA: Diagnosis not present

## 2019-07-03 DIAGNOSIS — E538 Deficiency of other specified B group vitamins: Secondary | ICD-10-CM | POA: Diagnosis not present

## 2019-07-03 DIAGNOSIS — E782 Mixed hyperlipidemia: Secondary | ICD-10-CM | POA: Diagnosis not present

## 2019-07-03 DIAGNOSIS — I2510 Atherosclerotic heart disease of native coronary artery without angina pectoris: Secondary | ICD-10-CM | POA: Diagnosis not present

## 2019-07-03 DIAGNOSIS — Z950 Presence of cardiac pacemaker: Secondary | ICD-10-CM | POA: Diagnosis not present

## 2019-07-03 DIAGNOSIS — K219 Gastro-esophageal reflux disease without esophagitis: Secondary | ICD-10-CM | POA: Diagnosis not present

## 2019-07-03 DIAGNOSIS — I10 Essential (primary) hypertension: Secondary | ICD-10-CM | POA: Diagnosis not present

## 2019-07-03 DIAGNOSIS — M545 Low back pain: Secondary | ICD-10-CM | POA: Diagnosis not present

## 2019-07-30 DIAGNOSIS — K2900 Acute gastritis without bleeding: Secondary | ICD-10-CM | POA: Diagnosis not present

## 2019-07-30 DIAGNOSIS — R1013 Epigastric pain: Secondary | ICD-10-CM | POA: Diagnosis not present

## 2019-07-30 DIAGNOSIS — K219 Gastro-esophageal reflux disease without esophagitis: Secondary | ICD-10-CM | POA: Diagnosis not present

## 2019-07-30 DIAGNOSIS — K3184 Gastroparesis: Secondary | ICD-10-CM | POA: Diagnosis not present

## 2019-08-01 DIAGNOSIS — R1032 Left lower quadrant pain: Secondary | ICD-10-CM | POA: Diagnosis not present

## 2019-08-03 DIAGNOSIS — Z950 Presence of cardiac pacemaker: Secondary | ICD-10-CM | POA: Diagnosis not present

## 2019-08-03 DIAGNOSIS — I10 Essential (primary) hypertension: Secondary | ICD-10-CM | POA: Diagnosis not present

## 2019-08-03 DIAGNOSIS — I2510 Atherosclerotic heart disease of native coronary artery without angina pectoris: Secondary | ICD-10-CM | POA: Diagnosis not present

## 2019-08-03 DIAGNOSIS — E782 Mixed hyperlipidemia: Secondary | ICD-10-CM | POA: Diagnosis not present

## 2019-08-03 DIAGNOSIS — M545 Low back pain: Secondary | ICD-10-CM | POA: Diagnosis not present

## 2019-08-03 DIAGNOSIS — K219 Gastro-esophageal reflux disease without esophagitis: Secondary | ICD-10-CM | POA: Diagnosis not present

## 2019-08-03 DIAGNOSIS — E538 Deficiency of other specified B group vitamins: Secondary | ICD-10-CM | POA: Diagnosis not present

## 2019-08-03 DIAGNOSIS — K30 Functional dyspepsia: Secondary | ICD-10-CM | POA: Diagnosis not present

## 2019-08-30 DIAGNOSIS — E538 Deficiency of other specified B group vitamins: Secondary | ICD-10-CM | POA: Diagnosis not present

## 2019-08-30 DIAGNOSIS — M545 Low back pain: Secondary | ICD-10-CM | POA: Diagnosis not present

## 2019-08-30 DIAGNOSIS — K219 Gastro-esophageal reflux disease without esophagitis: Secondary | ICD-10-CM | POA: Diagnosis not present

## 2019-08-30 DIAGNOSIS — K30 Functional dyspepsia: Secondary | ICD-10-CM | POA: Diagnosis not present

## 2019-08-30 DIAGNOSIS — I10 Essential (primary) hypertension: Secondary | ICD-10-CM | POA: Diagnosis not present

## 2019-08-30 DIAGNOSIS — E782 Mixed hyperlipidemia: Secondary | ICD-10-CM | POA: Diagnosis not present

## 2019-08-30 DIAGNOSIS — I2510 Atherosclerotic heart disease of native coronary artery without angina pectoris: Secondary | ICD-10-CM | POA: Diagnosis not present

## 2019-08-30 DIAGNOSIS — Z950 Presence of cardiac pacemaker: Secondary | ICD-10-CM | POA: Diagnosis not present

## 2019-09-04 DIAGNOSIS — Z45018 Encounter for adjustment and management of other part of cardiac pacemaker: Secondary | ICD-10-CM | POA: Diagnosis not present

## 2019-09-11 DIAGNOSIS — E782 Mixed hyperlipidemia: Secondary | ICD-10-CM | POA: Diagnosis not present

## 2019-09-19 DIAGNOSIS — I10 Essential (primary) hypertension: Secondary | ICD-10-CM | POA: Diagnosis not present

## 2019-09-19 DIAGNOSIS — E785 Hyperlipidemia, unspecified: Secondary | ICD-10-CM | POA: Diagnosis not present

## 2019-09-19 DIAGNOSIS — I2510 Atherosclerotic heart disease of native coronary artery without angina pectoris: Secondary | ICD-10-CM | POA: Diagnosis not present

## 2019-09-19 DIAGNOSIS — I442 Atrioventricular block, complete: Secondary | ICD-10-CM | POA: Diagnosis not present

## 2019-09-19 DIAGNOSIS — Z45018 Encounter for adjustment and management of other part of cardiac pacemaker: Secondary | ICD-10-CM | POA: Diagnosis not present

## 2019-09-19 DIAGNOSIS — Z951 Presence of aortocoronary bypass graft: Secondary | ICD-10-CM | POA: Diagnosis not present

## 2019-09-27 DIAGNOSIS — Z950 Presence of cardiac pacemaker: Secondary | ICD-10-CM | POA: Diagnosis not present

## 2019-09-27 DIAGNOSIS — I2510 Atherosclerotic heart disease of native coronary artery without angina pectoris: Secondary | ICD-10-CM | POA: Diagnosis not present

## 2019-09-27 DIAGNOSIS — K3184 Gastroparesis: Secondary | ICD-10-CM | POA: Diagnosis not present

## 2019-09-27 DIAGNOSIS — K219 Gastro-esophageal reflux disease without esophagitis: Secondary | ICD-10-CM | POA: Diagnosis not present

## 2019-09-27 DIAGNOSIS — M545 Low back pain: Secondary | ICD-10-CM | POA: Diagnosis not present

## 2019-09-27 DIAGNOSIS — E782 Mixed hyperlipidemia: Secondary | ICD-10-CM | POA: Diagnosis not present

## 2019-09-27 DIAGNOSIS — E538 Deficiency of other specified B group vitamins: Secondary | ICD-10-CM | POA: Diagnosis not present

## 2019-10-30 DIAGNOSIS — Z23 Encounter for immunization: Secondary | ICD-10-CM | POA: Diagnosis not present

## 2019-10-30 DIAGNOSIS — Z79899 Other long term (current) drug therapy: Secondary | ICD-10-CM | POA: Diagnosis not present

## 2019-10-30 DIAGNOSIS — E782 Mixed hyperlipidemia: Secondary | ICD-10-CM | POA: Diagnosis not present

## 2019-10-30 DIAGNOSIS — I2510 Atherosclerotic heart disease of native coronary artery without angina pectoris: Secondary | ICD-10-CM | POA: Diagnosis not present

## 2019-10-30 DIAGNOSIS — M6740 Ganglion, unspecified site: Secondary | ICD-10-CM | POA: Diagnosis not present

## 2019-10-30 DIAGNOSIS — E538 Deficiency of other specified B group vitamins: Secondary | ICD-10-CM | POA: Diagnosis not present

## 2019-10-30 DIAGNOSIS — K219 Gastro-esophageal reflux disease without esophagitis: Secondary | ICD-10-CM | POA: Diagnosis not present

## 2019-10-30 DIAGNOSIS — M545 Low back pain: Secondary | ICD-10-CM | POA: Diagnosis not present

## 2019-10-30 DIAGNOSIS — Z950 Presence of cardiac pacemaker: Secondary | ICD-10-CM | POA: Diagnosis not present

## 2019-11-21 DIAGNOSIS — I2510 Atherosclerotic heart disease of native coronary artery without angina pectoris: Secondary | ICD-10-CM | POA: Diagnosis not present

## 2019-11-21 DIAGNOSIS — M545 Low back pain, unspecified: Secondary | ICD-10-CM | POA: Diagnosis not present

## 2019-12-03 DIAGNOSIS — Z950 Presence of cardiac pacemaker: Secondary | ICD-10-CM | POA: Diagnosis not present

## 2019-12-10 DIAGNOSIS — K5909 Other constipation: Secondary | ICD-10-CM | POA: Diagnosis not present

## 2019-12-10 DIAGNOSIS — K30 Functional dyspepsia: Secondary | ICD-10-CM | POA: Diagnosis not present

## 2019-12-10 DIAGNOSIS — K3184 Gastroparesis: Secondary | ICD-10-CM | POA: Diagnosis not present

## 2019-12-28 DIAGNOSIS — I2510 Atherosclerotic heart disease of native coronary artery without angina pectoris: Secondary | ICD-10-CM | POA: Diagnosis not present

## 2019-12-28 DIAGNOSIS — K219 Gastro-esophageal reflux disease without esophagitis: Secondary | ICD-10-CM | POA: Diagnosis not present

## 2019-12-28 DIAGNOSIS — E782 Mixed hyperlipidemia: Secondary | ICD-10-CM | POA: Diagnosis not present

## 2019-12-28 DIAGNOSIS — Z950 Presence of cardiac pacemaker: Secondary | ICD-10-CM | POA: Diagnosis not present

## 2019-12-28 DIAGNOSIS — E538 Deficiency of other specified B group vitamins: Secondary | ICD-10-CM | POA: Diagnosis not present

## 2019-12-28 DIAGNOSIS — K5900 Constipation, unspecified: Secondary | ICD-10-CM | POA: Diagnosis not present

## 2020-01-08 DIAGNOSIS — E785 Hyperlipidemia, unspecified: Secondary | ICD-10-CM | POA: Diagnosis not present

## 2020-01-08 DIAGNOSIS — I442 Atrioventricular block, complete: Secondary | ICD-10-CM | POA: Diagnosis not present

## 2020-01-08 DIAGNOSIS — I10 Essential (primary) hypertension: Secondary | ICD-10-CM | POA: Diagnosis not present

## 2020-01-08 DIAGNOSIS — Z45018 Encounter for adjustment and management of other part of cardiac pacemaker: Secondary | ICD-10-CM | POA: Diagnosis not present

## 2020-01-08 DIAGNOSIS — I2510 Atherosclerotic heart disease of native coronary artery without angina pectoris: Secondary | ICD-10-CM | POA: Diagnosis not present

## 2020-01-08 DIAGNOSIS — Z951 Presence of aortocoronary bypass graft: Secondary | ICD-10-CM | POA: Diagnosis not present

## 2020-01-25 DIAGNOSIS — K5900 Constipation, unspecified: Secondary | ICD-10-CM | POA: Diagnosis not present

## 2020-01-25 DIAGNOSIS — M545 Low back pain, unspecified: Secondary | ICD-10-CM | POA: Diagnosis not present

## 2020-01-25 DIAGNOSIS — E782 Mixed hyperlipidemia: Secondary | ICD-10-CM | POA: Diagnosis not present

## 2020-01-25 DIAGNOSIS — E538 Deficiency of other specified B group vitamins: Secondary | ICD-10-CM | POA: Diagnosis not present

## 2020-01-25 DIAGNOSIS — Z950 Presence of cardiac pacemaker: Secondary | ICD-10-CM | POA: Diagnosis not present

## 2020-01-25 DIAGNOSIS — K219 Gastro-esophageal reflux disease without esophagitis: Secondary | ICD-10-CM | POA: Diagnosis not present

## 2020-01-25 DIAGNOSIS — I2510 Atherosclerotic heart disease of native coronary artery without angina pectoris: Secondary | ICD-10-CM | POA: Diagnosis not present

## 2020-01-29 DIAGNOSIS — B349 Viral infection, unspecified: Secondary | ICD-10-CM | POA: Diagnosis not present

## 2020-01-29 DIAGNOSIS — J029 Acute pharyngitis, unspecified: Secondary | ICD-10-CM | POA: Diagnosis not present

## 2020-01-29 DIAGNOSIS — J020 Streptococcal pharyngitis: Secondary | ICD-10-CM | POA: Diagnosis not present

## 2020-02-22 DIAGNOSIS — M545 Low back pain, unspecified: Secondary | ICD-10-CM | POA: Diagnosis not present

## 2020-02-22 DIAGNOSIS — M25561 Pain in right knee: Secondary | ICD-10-CM | POA: Diagnosis not present

## 2020-02-27 DIAGNOSIS — M25561 Pain in right knee: Secondary | ICD-10-CM | POA: Diagnosis not present

## 2020-03-04 DIAGNOSIS — Z950 Presence of cardiac pacemaker: Secondary | ICD-10-CM | POA: Diagnosis not present

## 2020-03-24 DIAGNOSIS — I2510 Atherosclerotic heart disease of native coronary artery without angina pectoris: Secondary | ICD-10-CM | POA: Diagnosis not present

## 2020-03-24 DIAGNOSIS — Z45018 Encounter for adjustment and management of other part of cardiac pacemaker: Secondary | ICD-10-CM | POA: Diagnosis not present

## 2020-03-24 DIAGNOSIS — E785 Hyperlipidemia, unspecified: Secondary | ICD-10-CM | POA: Diagnosis not present

## 2020-03-24 DIAGNOSIS — I10 Essential (primary) hypertension: Secondary | ICD-10-CM | POA: Diagnosis not present

## 2020-03-24 DIAGNOSIS — Z951 Presence of aortocoronary bypass graft: Secondary | ICD-10-CM | POA: Diagnosis not present

## 2020-03-24 DIAGNOSIS — I442 Atrioventricular block, complete: Secondary | ICD-10-CM | POA: Diagnosis not present

## 2020-03-28 DIAGNOSIS — Z125 Encounter for screening for malignant neoplasm of prostate: Secondary | ICD-10-CM | POA: Diagnosis not present

## 2020-03-28 DIAGNOSIS — K219 Gastro-esophageal reflux disease without esophagitis: Secondary | ICD-10-CM | POA: Diagnosis not present

## 2020-03-28 DIAGNOSIS — I2510 Atherosclerotic heart disease of native coronary artery without angina pectoris: Secondary | ICD-10-CM | POA: Diagnosis not present

## 2020-03-28 DIAGNOSIS — E782 Mixed hyperlipidemia: Secondary | ICD-10-CM | POA: Diagnosis not present

## 2020-03-28 DIAGNOSIS — M545 Low back pain, unspecified: Secondary | ICD-10-CM | POA: Diagnosis not present

## 2020-03-28 DIAGNOSIS — Z950 Presence of cardiac pacemaker: Secondary | ICD-10-CM | POA: Diagnosis not present

## 2020-03-28 DIAGNOSIS — K5900 Constipation, unspecified: Secondary | ICD-10-CM | POA: Diagnosis not present

## 2020-03-28 DIAGNOSIS — E538 Deficiency of other specified B group vitamins: Secondary | ICD-10-CM | POA: Diagnosis not present

## 2020-03-31 DIAGNOSIS — Z125 Encounter for screening for malignant neoplasm of prostate: Secondary | ICD-10-CM | POA: Diagnosis not present

## 2020-04-08 DIAGNOSIS — R739 Hyperglycemia, unspecified: Secondary | ICD-10-CM | POA: Diagnosis not present

## 2020-04-08 DIAGNOSIS — E782 Mixed hyperlipidemia: Secondary | ICD-10-CM | POA: Diagnosis not present

## 2020-04-25 DIAGNOSIS — Z1331 Encounter for screening for depression: Secondary | ICD-10-CM | POA: Diagnosis not present

## 2020-04-25 DIAGNOSIS — Z9181 History of falling: Secondary | ICD-10-CM | POA: Diagnosis not present

## 2020-04-25 DIAGNOSIS — I2510 Atherosclerotic heart disease of native coronary artery without angina pectoris: Secondary | ICD-10-CM | POA: Diagnosis not present

## 2020-04-25 DIAGNOSIS — Z139 Encounter for screening, unspecified: Secondary | ICD-10-CM | POA: Diagnosis not present

## 2020-04-25 DIAGNOSIS — M545 Low back pain, unspecified: Secondary | ICD-10-CM | POA: Diagnosis not present

## 2020-05-26 DIAGNOSIS — K5900 Constipation, unspecified: Secondary | ICD-10-CM | POA: Diagnosis not present

## 2020-05-26 DIAGNOSIS — M545 Low back pain, unspecified: Secondary | ICD-10-CM | POA: Diagnosis not present

## 2020-05-26 DIAGNOSIS — I10 Essential (primary) hypertension: Secondary | ICD-10-CM | POA: Diagnosis not present

## 2020-05-26 DIAGNOSIS — I2510 Atherosclerotic heart disease of native coronary artery without angina pectoris: Secondary | ICD-10-CM | POA: Diagnosis not present

## 2020-05-26 DIAGNOSIS — Z950 Presence of cardiac pacemaker: Secondary | ICD-10-CM | POA: Diagnosis not present

## 2020-05-26 DIAGNOSIS — K219 Gastro-esophageal reflux disease without esophagitis: Secondary | ICD-10-CM | POA: Diagnosis not present

## 2020-06-03 DIAGNOSIS — Z45018 Encounter for adjustment and management of other part of cardiac pacemaker: Secondary | ICD-10-CM | POA: Diagnosis not present

## 2020-06-11 DIAGNOSIS — I442 Atrioventricular block, complete: Secondary | ICD-10-CM | POA: Diagnosis not present

## 2020-06-11 DIAGNOSIS — Z45018 Encounter for adjustment and management of other part of cardiac pacemaker: Secondary | ICD-10-CM | POA: Diagnosis not present

## 2020-06-24 DIAGNOSIS — I2510 Atherosclerotic heart disease of native coronary artery without angina pectoris: Secondary | ICD-10-CM | POA: Diagnosis not present

## 2020-06-24 DIAGNOSIS — K219 Gastro-esophageal reflux disease without esophagitis: Secondary | ICD-10-CM | POA: Diagnosis not present

## 2020-06-24 DIAGNOSIS — M545 Low back pain, unspecified: Secondary | ICD-10-CM | POA: Diagnosis not present

## 2020-06-24 DIAGNOSIS — Z950 Presence of cardiac pacemaker: Secondary | ICD-10-CM | POA: Diagnosis not present

## 2020-06-24 DIAGNOSIS — E782 Mixed hyperlipidemia: Secondary | ICD-10-CM | POA: Diagnosis not present

## 2020-06-24 DIAGNOSIS — I10 Essential (primary) hypertension: Secondary | ICD-10-CM | POA: Diagnosis not present

## 2020-06-24 DIAGNOSIS — K5900 Constipation, unspecified: Secondary | ICD-10-CM | POA: Diagnosis not present

## 2020-07-25 DIAGNOSIS — I10 Essential (primary) hypertension: Secondary | ICD-10-CM | POA: Diagnosis not present

## 2020-07-25 DIAGNOSIS — I2510 Atherosclerotic heart disease of native coronary artery without angina pectoris: Secondary | ICD-10-CM | POA: Diagnosis not present

## 2020-07-25 DIAGNOSIS — K219 Gastro-esophageal reflux disease without esophagitis: Secondary | ICD-10-CM | POA: Diagnosis not present

## 2020-07-25 DIAGNOSIS — Z950 Presence of cardiac pacemaker: Secondary | ICD-10-CM | POA: Diagnosis not present

## 2020-07-25 DIAGNOSIS — K5900 Constipation, unspecified: Secondary | ICD-10-CM | POA: Diagnosis not present

## 2020-07-25 DIAGNOSIS — E782 Mixed hyperlipidemia: Secondary | ICD-10-CM | POA: Diagnosis not present

## 2020-07-25 DIAGNOSIS — M545 Low back pain, unspecified: Secondary | ICD-10-CM | POA: Diagnosis not present

## 2020-08-20 DIAGNOSIS — K3184 Gastroparesis: Secondary | ICD-10-CM | POA: Diagnosis not present

## 2020-08-20 DIAGNOSIS — K219 Gastro-esophageal reflux disease without esophagitis: Secondary | ICD-10-CM | POA: Diagnosis not present

## 2020-08-20 DIAGNOSIS — K2270 Barrett's esophagus without dysplasia: Secondary | ICD-10-CM | POA: Diagnosis not present

## 2020-08-20 DIAGNOSIS — K208 Other esophagitis without bleeding: Secondary | ICD-10-CM | POA: Diagnosis not present

## 2020-08-25 DIAGNOSIS — E538 Deficiency of other specified B group vitamins: Secondary | ICD-10-CM | POA: Diagnosis not present

## 2020-08-25 DIAGNOSIS — K219 Gastro-esophageal reflux disease without esophagitis: Secondary | ICD-10-CM | POA: Diagnosis not present

## 2020-08-25 DIAGNOSIS — I2510 Atherosclerotic heart disease of native coronary artery without angina pectoris: Secondary | ICD-10-CM | POA: Diagnosis not present

## 2020-08-25 DIAGNOSIS — I10 Essential (primary) hypertension: Secondary | ICD-10-CM | POA: Diagnosis not present

## 2020-08-25 DIAGNOSIS — E782 Mixed hyperlipidemia: Secondary | ICD-10-CM | POA: Diagnosis not present

## 2020-08-25 DIAGNOSIS — R739 Hyperglycemia, unspecified: Secondary | ICD-10-CM | POA: Diagnosis not present

## 2020-08-25 DIAGNOSIS — M545 Low back pain, unspecified: Secondary | ICD-10-CM | POA: Diagnosis not present

## 2020-08-25 DIAGNOSIS — Z950 Presence of cardiac pacemaker: Secondary | ICD-10-CM | POA: Diagnosis not present

## 2020-09-01 DIAGNOSIS — E782 Mixed hyperlipidemia: Secondary | ICD-10-CM | POA: Diagnosis not present

## 2020-09-01 DIAGNOSIS — R739 Hyperglycemia, unspecified: Secondary | ICD-10-CM | POA: Diagnosis not present

## 2020-09-10 DIAGNOSIS — Z950 Presence of cardiac pacemaker: Secondary | ICD-10-CM | POA: Diagnosis not present

## 2020-09-18 DIAGNOSIS — M545 Low back pain, unspecified: Secondary | ICD-10-CM | POA: Diagnosis not present

## 2020-09-22 DIAGNOSIS — I442 Atrioventricular block, complete: Secondary | ICD-10-CM | POA: Diagnosis not present

## 2020-09-22 DIAGNOSIS — I10 Essential (primary) hypertension: Secondary | ICD-10-CM | POA: Diagnosis not present

## 2020-09-22 DIAGNOSIS — E785 Hyperlipidemia, unspecified: Secondary | ICD-10-CM | POA: Diagnosis not present

## 2020-09-22 DIAGNOSIS — I2510 Atherosclerotic heart disease of native coronary artery without angina pectoris: Secondary | ICD-10-CM | POA: Diagnosis not present

## 2020-09-22 DIAGNOSIS — Z45018 Encounter for adjustment and management of other part of cardiac pacemaker: Secondary | ICD-10-CM | POA: Diagnosis not present

## 2020-09-22 DIAGNOSIS — Z951 Presence of aortocoronary bypass graft: Secondary | ICD-10-CM | POA: Diagnosis not present

## 2020-10-27 DIAGNOSIS — M545 Low back pain, unspecified: Secondary | ICD-10-CM | POA: Diagnosis not present

## 2020-10-27 DIAGNOSIS — G8929 Other chronic pain: Secondary | ICD-10-CM | POA: Diagnosis not present

## 2020-10-27 DIAGNOSIS — M25561 Pain in right knee: Secondary | ICD-10-CM | POA: Diagnosis not present

## 2020-10-27 DIAGNOSIS — M25562 Pain in left knee: Secondary | ICD-10-CM | POA: Diagnosis not present

## 2020-10-27 DIAGNOSIS — Z79899 Other long term (current) drug therapy: Secondary | ICD-10-CM | POA: Diagnosis not present

## 2020-11-26 DIAGNOSIS — Z23 Encounter for immunization: Secondary | ICD-10-CM | POA: Diagnosis not present

## 2020-11-26 DIAGNOSIS — M545 Low back pain, unspecified: Secondary | ICD-10-CM | POA: Diagnosis not present

## 2020-12-15 DIAGNOSIS — Z45018 Encounter for adjustment and management of other part of cardiac pacemaker: Secondary | ICD-10-CM | POA: Diagnosis not present

## 2020-12-24 DIAGNOSIS — H2703 Aphakia, bilateral: Secondary | ICD-10-CM | POA: Diagnosis not present

## 2020-12-25 DIAGNOSIS — H539 Unspecified visual disturbance: Secondary | ICD-10-CM | POA: Diagnosis not present

## 2020-12-25 DIAGNOSIS — Z6824 Body mass index (BMI) 24.0-24.9, adult: Secondary | ICD-10-CM | POA: Diagnosis not present

## 2020-12-25 DIAGNOSIS — M545 Low back pain, unspecified: Secondary | ICD-10-CM | POA: Diagnosis not present

## 2020-12-29 DIAGNOSIS — E876 Hypokalemia: Secondary | ICD-10-CM | POA: Diagnosis not present

## 2021-01-09 DIAGNOSIS — Z951 Presence of aortocoronary bypass graft: Secondary | ICD-10-CM | POA: Diagnosis not present

## 2021-01-09 DIAGNOSIS — I10 Essential (primary) hypertension: Secondary | ICD-10-CM | POA: Diagnosis not present

## 2021-01-09 DIAGNOSIS — Z950 Presence of cardiac pacemaker: Secondary | ICD-10-CM | POA: Diagnosis not present

## 2021-01-09 DIAGNOSIS — I2510 Atherosclerotic heart disease of native coronary artery without angina pectoris: Secondary | ICD-10-CM | POA: Diagnosis not present

## 2021-01-09 DIAGNOSIS — E785 Hyperlipidemia, unspecified: Secondary | ICD-10-CM | POA: Diagnosis not present

## 2021-01-09 DIAGNOSIS — F17200 Nicotine dependence, unspecified, uncomplicated: Secondary | ICD-10-CM | POA: Diagnosis not present

## 2021-01-12 DIAGNOSIS — H539 Unspecified visual disturbance: Secondary | ICD-10-CM | POA: Diagnosis not present

## 2021-01-12 DIAGNOSIS — H538 Other visual disturbances: Secondary | ICD-10-CM | POA: Diagnosis not present

## 2021-01-12 DIAGNOSIS — H547 Unspecified visual loss: Secondary | ICD-10-CM | POA: Diagnosis not present

## 2021-01-12 DIAGNOSIS — G459 Transient cerebral ischemic attack, unspecified: Secondary | ICD-10-CM | POA: Diagnosis not present

## 2021-01-22 DIAGNOSIS — Z79899 Other long term (current) drug therapy: Secondary | ICD-10-CM | POA: Diagnosis not present

## 2021-01-22 DIAGNOSIS — E538 Deficiency of other specified B group vitamins: Secondary | ICD-10-CM | POA: Diagnosis not present

## 2021-01-22 DIAGNOSIS — R739 Hyperglycemia, unspecified: Secondary | ICD-10-CM | POA: Diagnosis not present

## 2021-01-22 DIAGNOSIS — E782 Mixed hyperlipidemia: Secondary | ICD-10-CM | POA: Diagnosis not present

## 2021-01-22 DIAGNOSIS — M545 Low back pain, unspecified: Secondary | ICD-10-CM | POA: Diagnosis not present

## 2021-01-22 DIAGNOSIS — I10 Essential (primary) hypertension: Secondary | ICD-10-CM | POA: Diagnosis not present

## 2021-01-22 DIAGNOSIS — K2100 Gastro-esophageal reflux disease with esophagitis, without bleeding: Secondary | ICD-10-CM | POA: Diagnosis not present

## 2021-01-22 DIAGNOSIS — Z6824 Body mass index (BMI) 24.0-24.9, adult: Secondary | ICD-10-CM | POA: Diagnosis not present

## 2021-01-26 DIAGNOSIS — K5900 Constipation, unspecified: Secondary | ICD-10-CM | POA: Diagnosis not present

## 2021-01-26 DIAGNOSIS — K3184 Gastroparesis: Secondary | ICD-10-CM | POA: Diagnosis not present

## 2021-02-24 DIAGNOSIS — Z6824 Body mass index (BMI) 24.0-24.9, adult: Secondary | ICD-10-CM | POA: Diagnosis not present

## 2021-02-24 DIAGNOSIS — M545 Low back pain, unspecified: Secondary | ICD-10-CM | POA: Diagnosis not present

## 2021-03-26 DIAGNOSIS — Z6824 Body mass index (BMI) 24.0-24.9, adult: Secondary | ICD-10-CM | POA: Diagnosis not present

## 2021-03-26 DIAGNOSIS — M545 Low back pain, unspecified: Secondary | ICD-10-CM | POA: Diagnosis not present

## 2021-04-06 DIAGNOSIS — I2510 Atherosclerotic heart disease of native coronary artery without angina pectoris: Secondary | ICD-10-CM | POA: Diagnosis not present

## 2021-04-06 DIAGNOSIS — R0789 Other chest pain: Secondary | ICD-10-CM | POA: Diagnosis not present

## 2021-04-06 DIAGNOSIS — R0609 Other forms of dyspnea: Secondary | ICD-10-CM | POA: Diagnosis not present

## 2021-04-06 DIAGNOSIS — Z45018 Encounter for adjustment and management of other part of cardiac pacemaker: Secondary | ICD-10-CM | POA: Diagnosis not present

## 2021-04-06 DIAGNOSIS — I10 Essential (primary) hypertension: Secondary | ICD-10-CM | POA: Diagnosis not present

## 2021-04-06 DIAGNOSIS — Z951 Presence of aortocoronary bypass graft: Secondary | ICD-10-CM | POA: Diagnosis not present

## 2021-04-06 DIAGNOSIS — E785 Hyperlipidemia, unspecified: Secondary | ICD-10-CM | POA: Diagnosis not present

## 2021-04-06 DIAGNOSIS — Z720 Tobacco use: Secondary | ICD-10-CM | POA: Diagnosis not present

## 2021-04-06 DIAGNOSIS — I442 Atrioventricular block, complete: Secondary | ICD-10-CM | POA: Diagnosis not present

## 2021-04-09 DIAGNOSIS — Z4509 Encounter for adjustment and management of other cardiac device: Secondary | ICD-10-CM | POA: Diagnosis not present

## 2021-04-10 DIAGNOSIS — I2510 Atherosclerotic heart disease of native coronary artery without angina pectoris: Secondary | ICD-10-CM | POA: Diagnosis not present

## 2021-04-10 DIAGNOSIS — Z950 Presence of cardiac pacemaker: Secondary | ICD-10-CM | POA: Diagnosis not present

## 2021-04-10 DIAGNOSIS — I340 Nonrheumatic mitral (valve) insufficiency: Secondary | ICD-10-CM | POA: Diagnosis not present

## 2021-04-14 DIAGNOSIS — I2510 Atherosclerotic heart disease of native coronary artery without angina pectoris: Secondary | ICD-10-CM | POA: Diagnosis not present

## 2021-04-14 DIAGNOSIS — R0609 Other forms of dyspnea: Secondary | ICD-10-CM | POA: Diagnosis not present

## 2021-04-22 DIAGNOSIS — K219 Gastro-esophageal reflux disease without esophagitis: Secondary | ICD-10-CM | POA: Diagnosis not present

## 2021-04-22 DIAGNOSIS — R600 Localized edema: Secondary | ICD-10-CM | POA: Diagnosis not present

## 2021-04-22 DIAGNOSIS — E785 Hyperlipidemia, unspecified: Secondary | ICD-10-CM | POA: Diagnosis not present

## 2021-04-22 DIAGNOSIS — Z6824 Body mass index (BMI) 24.0-24.9, adult: Secondary | ICD-10-CM | POA: Diagnosis not present

## 2021-04-22 DIAGNOSIS — M545 Low back pain, unspecified: Secondary | ICD-10-CM | POA: Diagnosis not present

## 2021-04-22 DIAGNOSIS — R2231 Localized swelling, mass and lump, right upper limb: Secondary | ICD-10-CM | POA: Diagnosis not present

## 2021-04-22 DIAGNOSIS — Z79899 Other long term (current) drug therapy: Secondary | ICD-10-CM | POA: Diagnosis not present

## 2021-04-22 DIAGNOSIS — I10 Essential (primary) hypertension: Secondary | ICD-10-CM | POA: Diagnosis not present

## 2021-05-07 DIAGNOSIS — D2361 Other benign neoplasm of skin of right upper limb, including shoulder: Secondary | ICD-10-CM | POA: Diagnosis not present

## 2021-05-07 DIAGNOSIS — R2231 Localized swelling, mass and lump, right upper limb: Secondary | ICD-10-CM | POA: Diagnosis not present

## 2021-05-08 DIAGNOSIS — L720 Epidermal cyst: Secondary | ICD-10-CM | POA: Diagnosis not present

## 2021-05-18 DIAGNOSIS — T8131XA Disruption of external operation (surgical) wound, not elsewhere classified, initial encounter: Secondary | ICD-10-CM | POA: Diagnosis not present

## 2021-05-21 DIAGNOSIS — Z6824 Body mass index (BMI) 24.0-24.9, adult: Secondary | ICD-10-CM | POA: Diagnosis not present

## 2021-05-21 DIAGNOSIS — Z1331 Encounter for screening for depression: Secondary | ICD-10-CM | POA: Diagnosis not present

## 2021-05-21 DIAGNOSIS — Z9181 History of falling: Secondary | ICD-10-CM | POA: Diagnosis not present

## 2021-05-21 DIAGNOSIS — I10 Essential (primary) hypertension: Secondary | ICD-10-CM | POA: Diagnosis not present

## 2021-05-21 DIAGNOSIS — M545 Low back pain, unspecified: Secondary | ICD-10-CM | POA: Diagnosis not present

## 2021-05-21 DIAGNOSIS — Z139 Encounter for screening, unspecified: Secondary | ICD-10-CM | POA: Diagnosis not present

## 2021-06-01 DIAGNOSIS — D72829 Elevated white blood cell count, unspecified: Secondary | ICD-10-CM | POA: Diagnosis not present

## 2021-06-01 DIAGNOSIS — E876 Hypokalemia: Secondary | ICD-10-CM | POA: Diagnosis not present

## 2021-06-02 DIAGNOSIS — Z6824 Body mass index (BMI) 24.0-24.9, adult: Secondary | ICD-10-CM | POA: Diagnosis not present

## 2021-06-02 DIAGNOSIS — R600 Localized edema: Secondary | ICD-10-CM | POA: Diagnosis not present

## 2021-06-08 DIAGNOSIS — E876 Hypokalemia: Secondary | ICD-10-CM | POA: Diagnosis not present

## 2021-06-16 DIAGNOSIS — Z951 Presence of aortocoronary bypass graft: Secondary | ICD-10-CM | POA: Diagnosis not present

## 2021-06-16 DIAGNOSIS — R0609 Other forms of dyspnea: Secondary | ICD-10-CM | POA: Diagnosis not present

## 2021-06-16 DIAGNOSIS — I2510 Atherosclerotic heart disease of native coronary artery without angina pectoris: Secondary | ICD-10-CM | POA: Diagnosis not present

## 2021-06-16 DIAGNOSIS — R7989 Other specified abnormal findings of blood chemistry: Secondary | ICD-10-CM | POA: Diagnosis not present

## 2021-06-16 DIAGNOSIS — Z45018 Encounter for adjustment and management of other part of cardiac pacemaker: Secondary | ICD-10-CM | POA: Diagnosis not present

## 2021-06-16 DIAGNOSIS — I10 Essential (primary) hypertension: Secondary | ICD-10-CM | POA: Diagnosis not present

## 2021-06-16 DIAGNOSIS — I442 Atrioventricular block, complete: Secondary | ICD-10-CM | POA: Diagnosis not present

## 2021-06-17 DIAGNOSIS — M545 Low back pain, unspecified: Secondary | ICD-10-CM | POA: Diagnosis not present

## 2021-06-17 DIAGNOSIS — Z6824 Body mass index (BMI) 24.0-24.9, adult: Secondary | ICD-10-CM | POA: Diagnosis not present

## 2021-06-21 DIAGNOSIS — Z45018 Encounter for adjustment and management of other part of cardiac pacemaker: Secondary | ICD-10-CM | POA: Diagnosis not present

## 2021-06-21 DIAGNOSIS — I472 Ventricular tachycardia, unspecified: Secondary | ICD-10-CM | POA: Diagnosis not present

## 2021-07-07 DIAGNOSIS — E785 Hyperlipidemia, unspecified: Secondary | ICD-10-CM | POA: Diagnosis not present

## 2021-07-07 DIAGNOSIS — Z951 Presence of aortocoronary bypass graft: Secondary | ICD-10-CM | POA: Diagnosis not present

## 2021-07-07 DIAGNOSIS — Z79899 Other long term (current) drug therapy: Secondary | ICD-10-CM | POA: Diagnosis not present

## 2021-07-07 DIAGNOSIS — Z950 Presence of cardiac pacemaker: Secondary | ICD-10-CM | POA: Diagnosis not present

## 2021-07-07 DIAGNOSIS — Z7982 Long term (current) use of aspirin: Secondary | ICD-10-CM | POA: Diagnosis not present

## 2021-07-07 DIAGNOSIS — I442 Atrioventricular block, complete: Secondary | ICD-10-CM | POA: Diagnosis not present

## 2021-07-07 DIAGNOSIS — F17200 Nicotine dependence, unspecified, uncomplicated: Secondary | ICD-10-CM | POA: Diagnosis not present

## 2021-07-07 DIAGNOSIS — I2510 Atherosclerotic heart disease of native coronary artery without angina pectoris: Secondary | ICD-10-CM | POA: Diagnosis not present

## 2021-07-07 DIAGNOSIS — I10 Essential (primary) hypertension: Secondary | ICD-10-CM | POA: Diagnosis not present

## 2021-07-15 DIAGNOSIS — I2510 Atherosclerotic heart disease of native coronary artery without angina pectoris: Secondary | ICD-10-CM | POA: Diagnosis not present

## 2021-07-15 DIAGNOSIS — I442 Atrioventricular block, complete: Secondary | ICD-10-CM | POA: Diagnosis not present

## 2021-07-15 DIAGNOSIS — I10 Essential (primary) hypertension: Secondary | ICD-10-CM | POA: Diagnosis not present

## 2021-07-15 DIAGNOSIS — Z950 Presence of cardiac pacemaker: Secondary | ICD-10-CM | POA: Diagnosis not present

## 2021-07-15 DIAGNOSIS — Z951 Presence of aortocoronary bypass graft: Secondary | ICD-10-CM | POA: Diagnosis not present

## 2021-07-20 DIAGNOSIS — Z6825 Body mass index (BMI) 25.0-25.9, adult: Secondary | ICD-10-CM | POA: Diagnosis not present

## 2021-07-20 DIAGNOSIS — M545 Low back pain, unspecified: Secondary | ICD-10-CM | POA: Diagnosis not present

## 2021-07-20 DIAGNOSIS — Z79899 Other long term (current) drug therapy: Secondary | ICD-10-CM | POA: Diagnosis not present

## 2021-08-03 DIAGNOSIS — Z86010 Personal history of colonic polyps: Secondary | ICD-10-CM | POA: Diagnosis not present

## 2021-08-03 DIAGNOSIS — K3184 Gastroparesis: Secondary | ICD-10-CM | POA: Diagnosis not present

## 2021-08-03 DIAGNOSIS — K219 Gastro-esophageal reflux disease without esophagitis: Secondary | ICD-10-CM | POA: Diagnosis not present

## 2021-08-03 DIAGNOSIS — K5900 Constipation, unspecified: Secondary | ICD-10-CM | POA: Diagnosis not present

## 2021-08-05 DIAGNOSIS — I700 Atherosclerosis of aorta: Secondary | ICD-10-CM | POA: Diagnosis not present

## 2021-08-05 DIAGNOSIS — I10 Essential (primary) hypertension: Secondary | ICD-10-CM | POA: Diagnosis not present

## 2021-08-05 DIAGNOSIS — Z951 Presence of aortocoronary bypass graft: Secondary | ICD-10-CM | POA: Diagnosis not present

## 2021-08-05 DIAGNOSIS — I442 Atrioventricular block, complete: Secondary | ICD-10-CM | POA: Diagnosis not present

## 2021-08-05 DIAGNOSIS — M545 Low back pain, unspecified: Secondary | ICD-10-CM | POA: Diagnosis not present

## 2021-08-05 DIAGNOSIS — Z79891 Long term (current) use of opiate analgesic: Secondary | ICD-10-CM | POA: Diagnosis not present

## 2021-08-05 DIAGNOSIS — Z950 Presence of cardiac pacemaker: Secondary | ICD-10-CM | POA: Diagnosis not present

## 2021-08-05 DIAGNOSIS — I2510 Atherosclerotic heart disease of native coronary artery without angina pectoris: Secondary | ICD-10-CM | POA: Diagnosis not present

## 2021-08-05 DIAGNOSIS — F17210 Nicotine dependence, cigarettes, uncomplicated: Secondary | ICD-10-CM | POA: Diagnosis not present

## 2021-08-05 DIAGNOSIS — E785 Hyperlipidemia, unspecified: Secondary | ICD-10-CM | POA: Diagnosis not present

## 2021-08-12 DIAGNOSIS — I442 Atrioventricular block, complete: Secondary | ICD-10-CM | POA: Diagnosis not present

## 2021-08-12 DIAGNOSIS — Z45018 Encounter for adjustment and management of other part of cardiac pacemaker: Secondary | ICD-10-CM | POA: Diagnosis not present

## 2021-08-12 DIAGNOSIS — I519 Heart disease, unspecified: Secondary | ICD-10-CM | POA: Diagnosis not present

## 2021-08-12 DIAGNOSIS — R001 Bradycardia, unspecified: Secondary | ICD-10-CM | POA: Diagnosis not present

## 2021-08-18 DIAGNOSIS — M545 Low back pain, unspecified: Secondary | ICD-10-CM | POA: Diagnosis not present

## 2021-08-18 DIAGNOSIS — E785 Hyperlipidemia, unspecified: Secondary | ICD-10-CM | POA: Diagnosis not present

## 2021-08-18 DIAGNOSIS — Z6824 Body mass index (BMI) 24.0-24.9, adult: Secondary | ICD-10-CM | POA: Diagnosis not present

## 2021-08-18 DIAGNOSIS — R739 Hyperglycemia, unspecified: Secondary | ICD-10-CM | POA: Diagnosis not present

## 2021-08-18 DIAGNOSIS — E538 Deficiency of other specified B group vitamins: Secondary | ICD-10-CM | POA: Diagnosis not present

## 2021-08-18 DIAGNOSIS — I10 Essential (primary) hypertension: Secondary | ICD-10-CM | POA: Diagnosis not present

## 2021-08-20 DIAGNOSIS — E871 Hypo-osmolality and hyponatremia: Secondary | ICD-10-CM | POA: Diagnosis not present

## 2021-08-20 DIAGNOSIS — N179 Acute kidney failure, unspecified: Secondary | ICD-10-CM | POA: Diagnosis not present

## 2021-08-20 DIAGNOSIS — R112 Nausea with vomiting, unspecified: Secondary | ICD-10-CM | POA: Diagnosis not present

## 2021-08-20 DIAGNOSIS — E860 Dehydration: Secondary | ICD-10-CM | POA: Diagnosis not present

## 2021-08-23 DIAGNOSIS — E785 Hyperlipidemia, unspecified: Secondary | ICD-10-CM | POA: Diagnosis not present

## 2021-08-23 DIAGNOSIS — Z951 Presence of aortocoronary bypass graft: Secondary | ICD-10-CM | POA: Diagnosis not present

## 2021-08-23 DIAGNOSIS — Z79899 Other long term (current) drug therapy: Secondary | ICD-10-CM | POA: Diagnosis not present

## 2021-08-23 DIAGNOSIS — R7989 Other specified abnormal findings of blood chemistry: Secondary | ICD-10-CM | POA: Diagnosis not present

## 2021-08-23 DIAGNOSIS — I200 Unstable angina: Secondary | ICD-10-CM | POA: Diagnosis not present

## 2021-08-23 DIAGNOSIS — Z79891 Long term (current) use of opiate analgesic: Secondary | ICD-10-CM | POA: Diagnosis not present

## 2021-08-23 DIAGNOSIS — Z7982 Long term (current) use of aspirin: Secondary | ICD-10-CM | POA: Diagnosis not present

## 2021-08-23 DIAGNOSIS — G8929 Other chronic pain: Secondary | ICD-10-CM | POA: Diagnosis not present

## 2021-08-23 DIAGNOSIS — I081 Rheumatic disorders of both mitral and tricuspid valves: Secondary | ICD-10-CM | POA: Diagnosis not present

## 2021-08-23 DIAGNOSIS — I272 Pulmonary hypertension, unspecified: Secondary | ICD-10-CM | POA: Diagnosis not present

## 2021-08-23 DIAGNOSIS — I25110 Atherosclerotic heart disease of native coronary artery with unstable angina pectoris: Secondary | ICD-10-CM | POA: Diagnosis not present

## 2021-08-23 DIAGNOSIS — I700 Atherosclerosis of aorta: Secondary | ICD-10-CM | POA: Diagnosis not present

## 2021-08-23 DIAGNOSIS — K219 Gastro-esophageal reflux disease without esophagitis: Secondary | ICD-10-CM | POA: Diagnosis not present

## 2021-08-23 DIAGNOSIS — I2510 Atherosclerotic heart disease of native coronary artery without angina pectoris: Secondary | ICD-10-CM | POA: Diagnosis not present

## 2021-08-23 DIAGNOSIS — Z950 Presence of cardiac pacemaker: Secondary | ICD-10-CM | POA: Diagnosis not present

## 2021-08-23 DIAGNOSIS — F17210 Nicotine dependence, cigarettes, uncomplicated: Secondary | ICD-10-CM | POA: Diagnosis not present

## 2021-08-23 DIAGNOSIS — I10 Essential (primary) hypertension: Secondary | ICD-10-CM | POA: Diagnosis not present

## 2021-08-23 DIAGNOSIS — R079 Chest pain, unspecified: Secondary | ICD-10-CM | POA: Diagnosis not present

## 2021-08-23 DIAGNOSIS — E871 Hypo-osmolality and hyponatremia: Secondary | ICD-10-CM | POA: Diagnosis not present

## 2021-08-24 DIAGNOSIS — Z951 Presence of aortocoronary bypass graft: Secondary | ICD-10-CM | POA: Diagnosis not present

## 2021-08-24 DIAGNOSIS — I442 Atrioventricular block, complete: Secondary | ICD-10-CM | POA: Diagnosis not present

## 2021-08-24 DIAGNOSIS — G8929 Other chronic pain: Secondary | ICD-10-CM | POA: Diagnosis not present

## 2021-08-24 DIAGNOSIS — E871 Hypo-osmolality and hyponatremia: Secondary | ICD-10-CM | POA: Diagnosis not present

## 2021-08-24 DIAGNOSIS — I272 Pulmonary hypertension, unspecified: Secondary | ICD-10-CM | POA: Diagnosis not present

## 2021-08-24 DIAGNOSIS — E785 Hyperlipidemia, unspecified: Secondary | ICD-10-CM | POA: Diagnosis not present

## 2021-08-24 DIAGNOSIS — R079 Chest pain, unspecified: Secondary | ICD-10-CM | POA: Diagnosis not present

## 2021-08-24 DIAGNOSIS — I340 Nonrheumatic mitral (valve) insufficiency: Secondary | ICD-10-CM | POA: Diagnosis not present

## 2021-08-24 DIAGNOSIS — Z7982 Long term (current) use of aspirin: Secondary | ICD-10-CM | POA: Diagnosis not present

## 2021-08-24 DIAGNOSIS — I119 Hypertensive heart disease without heart failure: Secondary | ICD-10-CM | POA: Diagnosis not present

## 2021-08-24 DIAGNOSIS — N183 Chronic kidney disease, stage 3 unspecified: Secondary | ICD-10-CM | POA: Diagnosis not present

## 2021-08-24 DIAGNOSIS — K219 Gastro-esophageal reflux disease without esophagitis: Secondary | ICD-10-CM | POA: Diagnosis not present

## 2021-08-24 DIAGNOSIS — R0789 Other chest pain: Secondary | ICD-10-CM | POA: Diagnosis not present

## 2021-08-24 DIAGNOSIS — R7989 Other specified abnormal findings of blood chemistry: Secondary | ICD-10-CM | POA: Diagnosis not present

## 2021-08-24 DIAGNOSIS — I129 Hypertensive chronic kidney disease with stage 1 through stage 4 chronic kidney disease, or unspecified chronic kidney disease: Secondary | ICD-10-CM | POA: Diagnosis not present

## 2021-08-24 DIAGNOSIS — I081 Rheumatic disorders of both mitral and tricuspid valves: Secondary | ICD-10-CM | POA: Diagnosis not present

## 2021-08-24 DIAGNOSIS — I25110 Atherosclerotic heart disease of native coronary artery with unstable angina pectoris: Secondary | ICD-10-CM | POA: Diagnosis not present

## 2021-08-24 DIAGNOSIS — Z950 Presence of cardiac pacemaker: Secondary | ICD-10-CM | POA: Diagnosis not present

## 2021-08-24 DIAGNOSIS — I2510 Atherosclerotic heart disease of native coronary artery without angina pectoris: Secondary | ICD-10-CM | POA: Diagnosis not present

## 2021-08-25 DIAGNOSIS — R0789 Other chest pain: Secondary | ICD-10-CM | POA: Diagnosis not present

## 2021-08-25 DIAGNOSIS — Z79899 Other long term (current) drug therapy: Secondary | ICD-10-CM | POA: Diagnosis not present

## 2021-08-25 DIAGNOSIS — G8929 Other chronic pain: Secondary | ICD-10-CM | POA: Diagnosis not present

## 2021-08-25 DIAGNOSIS — I129 Hypertensive chronic kidney disease with stage 1 through stage 4 chronic kidney disease, or unspecified chronic kidney disease: Secondary | ICD-10-CM | POA: Diagnosis not present

## 2021-08-25 DIAGNOSIS — N183 Chronic kidney disease, stage 3 unspecified: Secondary | ICD-10-CM | POA: Diagnosis not present

## 2021-08-25 DIAGNOSIS — R7989 Other specified abnormal findings of blood chemistry: Secondary | ICD-10-CM | POA: Diagnosis not present

## 2021-08-25 DIAGNOSIS — Z950 Presence of cardiac pacemaker: Secondary | ICD-10-CM | POA: Diagnosis not present

## 2021-08-25 DIAGNOSIS — Z7982 Long term (current) use of aspirin: Secondary | ICD-10-CM | POA: Diagnosis not present

## 2021-08-25 DIAGNOSIS — I25110 Atherosclerotic heart disease of native coronary artery with unstable angina pectoris: Secondary | ICD-10-CM | POA: Diagnosis not present

## 2021-08-25 DIAGNOSIS — Z951 Presence of aortocoronary bypass graft: Secondary | ICD-10-CM | POA: Diagnosis not present

## 2021-08-25 DIAGNOSIS — E871 Hypo-osmolality and hyponatremia: Secondary | ICD-10-CM | POA: Diagnosis not present

## 2021-08-25 DIAGNOSIS — K219 Gastro-esophageal reflux disease without esophagitis: Secondary | ICD-10-CM | POA: Diagnosis not present

## 2021-09-09 DIAGNOSIS — E871 Hypo-osmolality and hyponatremia: Secondary | ICD-10-CM | POA: Diagnosis not present

## 2021-09-09 DIAGNOSIS — N289 Disorder of kidney and ureter, unspecified: Secondary | ICD-10-CM | POA: Diagnosis not present

## 2021-09-21 DIAGNOSIS — N289 Disorder of kidney and ureter, unspecified: Secondary | ICD-10-CM | POA: Diagnosis not present

## 2021-09-21 DIAGNOSIS — M545 Low back pain, unspecified: Secondary | ICD-10-CM | POA: Diagnosis not present

## 2021-09-21 DIAGNOSIS — Z6825 Body mass index (BMI) 25.0-25.9, adult: Secondary | ICD-10-CM | POA: Diagnosis not present

## 2021-09-21 DIAGNOSIS — D649 Anemia, unspecified: Secondary | ICD-10-CM | POA: Diagnosis not present

## 2021-10-06 DIAGNOSIS — I10 Essential (primary) hypertension: Secondary | ICD-10-CM | POA: Diagnosis not present

## 2021-10-06 DIAGNOSIS — M546 Pain in thoracic spine: Secondary | ICD-10-CM | POA: Diagnosis not present

## 2021-10-06 DIAGNOSIS — E7800 Pure hypercholesterolemia, unspecified: Secondary | ICD-10-CM | POA: Diagnosis not present

## 2021-10-06 DIAGNOSIS — M25562 Pain in left knee: Secondary | ICD-10-CM | POA: Diagnosis not present

## 2021-10-06 DIAGNOSIS — G8929 Other chronic pain: Secondary | ICD-10-CM | POA: Diagnosis not present

## 2021-10-06 DIAGNOSIS — M545 Low back pain, unspecified: Secondary | ICD-10-CM | POA: Diagnosis not present

## 2021-10-06 DIAGNOSIS — Z9889 Other specified postprocedural states: Secondary | ICD-10-CM | POA: Diagnosis not present

## 2021-10-06 DIAGNOSIS — Z79899 Other long term (current) drug therapy: Secondary | ICD-10-CM | POA: Diagnosis not present

## 2021-10-06 DIAGNOSIS — I2510 Atherosclerotic heart disease of native coronary artery without angina pectoris: Secondary | ICD-10-CM | POA: Diagnosis not present

## 2021-10-13 DIAGNOSIS — Z79899 Other long term (current) drug therapy: Secondary | ICD-10-CM | POA: Diagnosis not present

## 2021-10-19 DIAGNOSIS — I442 Atrioventricular block, complete: Secondary | ICD-10-CM | POA: Diagnosis not present

## 2021-10-20 DIAGNOSIS — R030 Elevated blood-pressure reading, without diagnosis of hypertension: Secondary | ICD-10-CM | POA: Diagnosis not present

## 2021-10-20 DIAGNOSIS — G8929 Other chronic pain: Secondary | ICD-10-CM | POA: Diagnosis not present

## 2021-10-20 DIAGNOSIS — M546 Pain in thoracic spine: Secondary | ICD-10-CM | POA: Diagnosis not present

## 2021-10-20 DIAGNOSIS — Z9889 Other specified postprocedural states: Secondary | ICD-10-CM | POA: Diagnosis not present

## 2021-10-20 DIAGNOSIS — M25562 Pain in left knee: Secondary | ICD-10-CM | POA: Diagnosis not present

## 2021-10-20 DIAGNOSIS — Z79899 Other long term (current) drug therapy: Secondary | ICD-10-CM | POA: Diagnosis not present

## 2021-10-20 DIAGNOSIS — M5135 Other intervertebral disc degeneration, thoracolumbar region: Secondary | ICD-10-CM | POA: Diagnosis not present

## 2021-10-20 DIAGNOSIS — I10 Essential (primary) hypertension: Secondary | ICD-10-CM | POA: Diagnosis not present

## 2021-10-20 DIAGNOSIS — M545 Low back pain, unspecified: Secondary | ICD-10-CM | POA: Diagnosis not present

## 2021-10-20 DIAGNOSIS — I2510 Atherosclerotic heart disease of native coronary artery without angina pectoris: Secondary | ICD-10-CM | POA: Diagnosis not present

## 2021-10-21 DIAGNOSIS — I10 Essential (primary) hypertension: Secondary | ICD-10-CM | POA: Diagnosis not present

## 2021-10-21 DIAGNOSIS — E785 Hyperlipidemia, unspecified: Secondary | ICD-10-CM | POA: Diagnosis not present

## 2021-10-21 DIAGNOSIS — I2510 Atherosclerotic heart disease of native coronary artery without angina pectoris: Secondary | ICD-10-CM | POA: Diagnosis not present

## 2021-10-21 DIAGNOSIS — Z951 Presence of aortocoronary bypass graft: Secondary | ICD-10-CM | POA: Diagnosis not present

## 2021-10-21 DIAGNOSIS — Z950 Presence of cardiac pacemaker: Secondary | ICD-10-CM | POA: Diagnosis not present

## 2021-10-23 DIAGNOSIS — Z79899 Other long term (current) drug therapy: Secondary | ICD-10-CM | POA: Diagnosis not present

## 2021-11-19 DIAGNOSIS — R030 Elevated blood-pressure reading, without diagnosis of hypertension: Secondary | ICD-10-CM | POA: Diagnosis not present

## 2021-11-19 DIAGNOSIS — G8929 Other chronic pain: Secondary | ICD-10-CM | POA: Diagnosis not present

## 2021-11-19 DIAGNOSIS — I2510 Atherosclerotic heart disease of native coronary artery without angina pectoris: Secondary | ICD-10-CM | POA: Diagnosis not present

## 2021-11-19 DIAGNOSIS — M546 Pain in thoracic spine: Secondary | ICD-10-CM | POA: Diagnosis not present

## 2021-11-19 DIAGNOSIS — M25562 Pain in left knee: Secondary | ICD-10-CM | POA: Diagnosis not present

## 2021-11-19 DIAGNOSIS — I10 Essential (primary) hypertension: Secondary | ICD-10-CM | POA: Diagnosis not present

## 2021-11-19 DIAGNOSIS — Z9889 Other specified postprocedural states: Secondary | ICD-10-CM | POA: Diagnosis not present

## 2021-11-19 DIAGNOSIS — M545 Low back pain, unspecified: Secondary | ICD-10-CM | POA: Diagnosis not present

## 2021-11-19 DIAGNOSIS — M5135 Other intervertebral disc degeneration, thoracolumbar region: Secondary | ICD-10-CM | POA: Diagnosis not present

## 2021-11-19 DIAGNOSIS — Z79899 Other long term (current) drug therapy: Secondary | ICD-10-CM | POA: Diagnosis not present

## 2021-11-23 DIAGNOSIS — Z79899 Other long term (current) drug therapy: Secondary | ICD-10-CM | POA: Diagnosis not present

## 2021-12-08 DIAGNOSIS — Z125 Encounter for screening for malignant neoplasm of prostate: Secondary | ICD-10-CM | POA: Diagnosis not present

## 2021-12-08 DIAGNOSIS — R739 Hyperglycemia, unspecified: Secondary | ICD-10-CM | POA: Diagnosis not present

## 2021-12-08 DIAGNOSIS — I10 Essential (primary) hypertension: Secondary | ICD-10-CM | POA: Diagnosis not present

## 2021-12-08 DIAGNOSIS — Z6825 Body mass index (BMI) 25.0-25.9, adult: Secondary | ICD-10-CM | POA: Diagnosis not present

## 2021-12-08 DIAGNOSIS — M545 Low back pain, unspecified: Secondary | ICD-10-CM | POA: Diagnosis not present

## 2021-12-08 DIAGNOSIS — G8929 Other chronic pain: Secondary | ICD-10-CM | POA: Diagnosis not present

## 2021-12-08 DIAGNOSIS — E785 Hyperlipidemia, unspecified: Secondary | ICD-10-CM | POA: Diagnosis not present

## 2021-12-08 DIAGNOSIS — Z23 Encounter for immunization: Secondary | ICD-10-CM | POA: Diagnosis not present

## 2021-12-08 DIAGNOSIS — E538 Deficiency of other specified B group vitamins: Secondary | ICD-10-CM | POA: Diagnosis not present

## 2021-12-21 DIAGNOSIS — Z79899 Other long term (current) drug therapy: Secondary | ICD-10-CM | POA: Diagnosis not present

## 2021-12-21 DIAGNOSIS — M5135 Other intervertebral disc degeneration, thoracolumbar region: Secondary | ICD-10-CM | POA: Diagnosis not present

## 2021-12-21 DIAGNOSIS — I2510 Atherosclerotic heart disease of native coronary artery without angina pectoris: Secondary | ICD-10-CM | POA: Diagnosis not present

## 2021-12-21 DIAGNOSIS — M546 Pain in thoracic spine: Secondary | ICD-10-CM | POA: Diagnosis not present

## 2021-12-21 DIAGNOSIS — M25562 Pain in left knee: Secondary | ICD-10-CM | POA: Diagnosis not present

## 2021-12-21 DIAGNOSIS — M545 Low back pain, unspecified: Secondary | ICD-10-CM | POA: Diagnosis not present

## 2021-12-21 DIAGNOSIS — Z9889 Other specified postprocedural states: Secondary | ICD-10-CM | POA: Diagnosis not present

## 2021-12-21 DIAGNOSIS — Z79891 Long term (current) use of opiate analgesic: Secondary | ICD-10-CM | POA: Diagnosis not present

## 2021-12-21 DIAGNOSIS — R030 Elevated blood-pressure reading, without diagnosis of hypertension: Secondary | ICD-10-CM | POA: Diagnosis not present

## 2021-12-21 DIAGNOSIS — G8929 Other chronic pain: Secondary | ICD-10-CM | POA: Diagnosis not present

## 2021-12-21 DIAGNOSIS — I10 Essential (primary) hypertension: Secondary | ICD-10-CM | POA: Diagnosis not present

## 2021-12-23 DIAGNOSIS — Z79899 Other long term (current) drug therapy: Secondary | ICD-10-CM | POA: Diagnosis not present

## 2022-01-20 DIAGNOSIS — G8929 Other chronic pain: Secondary | ICD-10-CM | POA: Diagnosis not present

## 2022-01-20 DIAGNOSIS — I2510 Atherosclerotic heart disease of native coronary artery without angina pectoris: Secondary | ICD-10-CM | POA: Diagnosis not present

## 2022-01-20 DIAGNOSIS — F17200 Nicotine dependence, unspecified, uncomplicated: Secondary | ICD-10-CM | POA: Diagnosis not present

## 2022-01-20 DIAGNOSIS — I10 Essential (primary) hypertension: Secondary | ICD-10-CM | POA: Diagnosis not present

## 2022-01-20 DIAGNOSIS — M546 Pain in thoracic spine: Secondary | ICD-10-CM | POA: Diagnosis not present

## 2022-01-20 DIAGNOSIS — R030 Elevated blood-pressure reading, without diagnosis of hypertension: Secondary | ICD-10-CM | POA: Diagnosis not present

## 2022-01-20 DIAGNOSIS — Z9889 Other specified postprocedural states: Secondary | ICD-10-CM | POA: Diagnosis not present

## 2022-01-20 DIAGNOSIS — F17210 Nicotine dependence, cigarettes, uncomplicated: Secondary | ICD-10-CM | POA: Diagnosis not present

## 2022-01-20 DIAGNOSIS — M25562 Pain in left knee: Secondary | ICD-10-CM | POA: Diagnosis not present

## 2022-01-20 DIAGNOSIS — M5135 Other intervertebral disc degeneration, thoracolumbar region: Secondary | ICD-10-CM | POA: Diagnosis not present

## 2022-01-20 DIAGNOSIS — Z79899 Other long term (current) drug therapy: Secondary | ICD-10-CM | POA: Diagnosis not present

## 2022-01-20 DIAGNOSIS — M545 Low back pain, unspecified: Secondary | ICD-10-CM | POA: Diagnosis not present

## 2022-01-22 DIAGNOSIS — Z79899 Other long term (current) drug therapy: Secondary | ICD-10-CM | POA: Diagnosis not present

## 2022-02-08 DIAGNOSIS — I10 Essential (primary) hypertension: Secondary | ICD-10-CM | POA: Diagnosis not present

## 2022-02-08 DIAGNOSIS — Z45018 Encounter for adjustment and management of other part of cardiac pacemaker: Secondary | ICD-10-CM | POA: Diagnosis not present

## 2022-02-19 DIAGNOSIS — Z86010 Personal history of colonic polyps: Secondary | ICD-10-CM | POA: Diagnosis not present

## 2022-02-19 DIAGNOSIS — K3184 Gastroparesis: Secondary | ICD-10-CM | POA: Diagnosis not present

## 2022-02-19 DIAGNOSIS — K219 Gastro-esophageal reflux disease without esophagitis: Secondary | ICD-10-CM | POA: Diagnosis not present

## 2022-02-19 DIAGNOSIS — K5900 Constipation, unspecified: Secondary | ICD-10-CM | POA: Diagnosis not present

## 2022-02-19 DIAGNOSIS — Z79899 Other long term (current) drug therapy: Secondary | ICD-10-CM | POA: Diagnosis not present

## 2022-02-22 DIAGNOSIS — G8929 Other chronic pain: Secondary | ICD-10-CM | POA: Diagnosis not present

## 2022-02-22 DIAGNOSIS — Z79891 Long term (current) use of opiate analgesic: Secondary | ICD-10-CM | POA: Diagnosis not present

## 2022-02-22 DIAGNOSIS — M545 Low back pain, unspecified: Secondary | ICD-10-CM | POA: Diagnosis not present

## 2022-02-22 DIAGNOSIS — R030 Elevated blood-pressure reading, without diagnosis of hypertension: Secondary | ICD-10-CM | POA: Diagnosis not present

## 2022-02-22 DIAGNOSIS — Z79899 Other long term (current) drug therapy: Secondary | ICD-10-CM | POA: Diagnosis not present

## 2022-03-01 DIAGNOSIS — Z950 Presence of cardiac pacemaker: Secondary | ICD-10-CM | POA: Diagnosis not present

## 2022-03-01 DIAGNOSIS — I119 Hypertensive heart disease without heart failure: Secondary | ICD-10-CM | POA: Diagnosis not present

## 2022-03-01 DIAGNOSIS — Z951 Presence of aortocoronary bypass graft: Secondary | ICD-10-CM | POA: Diagnosis not present

## 2022-03-01 DIAGNOSIS — I2510 Atherosclerotic heart disease of native coronary artery without angina pectoris: Secondary | ICD-10-CM | POA: Diagnosis not present

## 2022-03-01 DIAGNOSIS — E785 Hyperlipidemia, unspecified: Secondary | ICD-10-CM | POA: Diagnosis not present

## 2022-03-10 DIAGNOSIS — R739 Hyperglycemia, unspecified: Secondary | ICD-10-CM | POA: Diagnosis not present

## 2022-03-10 DIAGNOSIS — K219 Gastro-esophageal reflux disease without esophagitis: Secondary | ICD-10-CM | POA: Diagnosis not present

## 2022-03-10 DIAGNOSIS — R197 Diarrhea, unspecified: Secondary | ICD-10-CM | POA: Diagnosis not present

## 2022-03-10 DIAGNOSIS — Z6825 Body mass index (BMI) 25.0-25.9, adult: Secondary | ICD-10-CM | POA: Diagnosis not present

## 2022-03-10 DIAGNOSIS — E785 Hyperlipidemia, unspecified: Secondary | ICD-10-CM | POA: Diagnosis not present

## 2022-03-10 DIAGNOSIS — E538 Deficiency of other specified B group vitamins: Secondary | ICD-10-CM | POA: Diagnosis not present

## 2022-03-10 DIAGNOSIS — I10 Essential (primary) hypertension: Secondary | ICD-10-CM | POA: Diagnosis not present

## 2022-03-15 DIAGNOSIS — R197 Diarrhea, unspecified: Secondary | ICD-10-CM | POA: Diagnosis not present

## 2022-03-17 DIAGNOSIS — Z6825 Body mass index (BMI) 25.0-25.9, adult: Secondary | ICD-10-CM | POA: Diagnosis not present

## 2022-03-17 DIAGNOSIS — M4804 Spinal stenosis, thoracic region: Secondary | ICD-10-CM | POA: Diagnosis not present

## 2022-03-23 ENCOUNTER — Other Ambulatory Visit (HOSPITAL_COMMUNITY): Payer: Self-pay | Admitting: Neurosurgery

## 2022-03-23 DIAGNOSIS — M4804 Spinal stenosis, thoracic region: Secondary | ICD-10-CM

## 2022-03-24 DIAGNOSIS — Z9889 Other specified postprocedural states: Secondary | ICD-10-CM | POA: Diagnosis not present

## 2022-03-24 DIAGNOSIS — M5135 Other intervertebral disc degeneration, thoracolumbar region: Secondary | ICD-10-CM | POA: Diagnosis not present

## 2022-03-24 DIAGNOSIS — M4804 Spinal stenosis, thoracic region: Secondary | ICD-10-CM | POA: Diagnosis not present

## 2022-03-24 DIAGNOSIS — M5414 Radiculopathy, thoracic region: Secondary | ICD-10-CM | POA: Diagnosis not present

## 2022-03-24 DIAGNOSIS — Z79899 Other long term (current) drug therapy: Secondary | ICD-10-CM | POA: Diagnosis not present

## 2022-04-21 DIAGNOSIS — M5135 Other intervertebral disc degeneration, thoracolumbar region: Secondary | ICD-10-CM | POA: Diagnosis not present

## 2022-04-21 DIAGNOSIS — R030 Elevated blood-pressure reading, without diagnosis of hypertension: Secondary | ICD-10-CM | POA: Diagnosis not present

## 2022-04-21 DIAGNOSIS — Z79899 Other long term (current) drug therapy: Secondary | ICD-10-CM | POA: Diagnosis not present

## 2022-05-08 DIAGNOSIS — Z45018 Encounter for adjustment and management of other part of cardiac pacemaker: Secondary | ICD-10-CM | POA: Diagnosis not present

## 2022-05-10 ENCOUNTER — Ambulatory Visit (HOSPITAL_COMMUNITY): Admission: RE | Admit: 2022-05-10 | Payer: PPO | Source: Ambulatory Visit

## 2022-05-10 ENCOUNTER — Encounter (HOSPITAL_COMMUNITY): Payer: Self-pay

## 2022-05-20 DIAGNOSIS — R030 Elevated blood-pressure reading, without diagnosis of hypertension: Secondary | ICD-10-CM | POA: Diagnosis not present

## 2022-05-20 DIAGNOSIS — M545 Low back pain, unspecified: Secondary | ICD-10-CM | POA: Diagnosis not present

## 2022-05-20 DIAGNOSIS — Z79891 Long term (current) use of opiate analgesic: Secondary | ICD-10-CM | POA: Diagnosis not present

## 2022-05-20 DIAGNOSIS — M25562 Pain in left knee: Secondary | ICD-10-CM | POA: Diagnosis not present

## 2022-05-20 DIAGNOSIS — G8929 Other chronic pain: Secondary | ICD-10-CM | POA: Diagnosis not present

## 2022-05-20 DIAGNOSIS — M5135 Other intervertebral disc degeneration, thoracolumbar region: Secondary | ICD-10-CM | POA: Diagnosis not present

## 2022-05-20 DIAGNOSIS — I10 Essential (primary) hypertension: Secondary | ICD-10-CM | POA: Diagnosis not present

## 2022-05-20 DIAGNOSIS — M546 Pain in thoracic spine: Secondary | ICD-10-CM | POA: Diagnosis not present

## 2022-05-20 DIAGNOSIS — Z79899 Other long term (current) drug therapy: Secondary | ICD-10-CM | POA: Diagnosis not present

## 2022-05-20 DIAGNOSIS — Z9889 Other specified postprocedural states: Secondary | ICD-10-CM | POA: Diagnosis not present

## 2022-05-21 DIAGNOSIS — M4804 Spinal stenosis, thoracic region: Secondary | ICD-10-CM | POA: Diagnosis not present

## 2022-05-21 DIAGNOSIS — M47814 Spondylosis without myelopathy or radiculopathy, thoracic region: Secondary | ICD-10-CM | POA: Diagnosis not present

## 2022-05-21 DIAGNOSIS — Z950 Presence of cardiac pacemaker: Secondary | ICD-10-CM | POA: Diagnosis not present

## 2022-05-21 DIAGNOSIS — M2578 Osteophyte, vertebrae: Secondary | ICD-10-CM | POA: Diagnosis not present

## 2022-05-24 DIAGNOSIS — Z79899 Other long term (current) drug therapy: Secondary | ICD-10-CM | POA: Diagnosis not present

## 2022-06-16 DIAGNOSIS — M4804 Spinal stenosis, thoracic region: Secondary | ICD-10-CM | POA: Diagnosis not present

## 2022-06-18 DIAGNOSIS — M546 Pain in thoracic spine: Secondary | ICD-10-CM | POA: Diagnosis not present

## 2022-06-18 DIAGNOSIS — M5135 Other intervertebral disc degeneration, thoracolumbar region: Secondary | ICD-10-CM | POA: Diagnosis not present

## 2022-06-18 DIAGNOSIS — Z79899 Other long term (current) drug therapy: Secondary | ICD-10-CM | POA: Diagnosis not present

## 2022-06-18 DIAGNOSIS — I10 Essential (primary) hypertension: Secondary | ICD-10-CM | POA: Diagnosis not present

## 2022-06-18 DIAGNOSIS — Z9889 Other specified postprocedural states: Secondary | ICD-10-CM | POA: Diagnosis not present

## 2022-06-18 DIAGNOSIS — G8929 Other chronic pain: Secondary | ICD-10-CM | POA: Diagnosis not present

## 2022-06-18 DIAGNOSIS — M25562 Pain in left knee: Secondary | ICD-10-CM | POA: Diagnosis not present

## 2022-06-18 DIAGNOSIS — Z79891 Long term (current) use of opiate analgesic: Secondary | ICD-10-CM | POA: Diagnosis not present

## 2022-06-18 DIAGNOSIS — R030 Elevated blood-pressure reading, without diagnosis of hypertension: Secondary | ICD-10-CM | POA: Diagnosis not present

## 2022-06-18 DIAGNOSIS — M545 Low back pain, unspecified: Secondary | ICD-10-CM | POA: Diagnosis not present

## 2022-06-21 ENCOUNTER — Other Ambulatory Visit: Payer: Self-pay | Admitting: Neurosurgery

## 2022-06-21 DIAGNOSIS — M4804 Spinal stenosis, thoracic region: Secondary | ICD-10-CM

## 2022-06-22 DIAGNOSIS — Z79899 Other long term (current) drug therapy: Secondary | ICD-10-CM | POA: Diagnosis not present

## 2022-06-28 ENCOUNTER — Ambulatory Visit
Admission: RE | Admit: 2022-06-28 | Discharge: 2022-06-28 | Disposition: A | Discharge status: Home / Self Care | Payer: PPO | Source: Ambulatory Visit | Attending: Neurosurgery | Admitting: Neurosurgery

## 2022-06-28 DIAGNOSIS — M4804 Spinal stenosis, thoracic region: Secondary | ICD-10-CM

## 2022-06-28 DIAGNOSIS — I700 Atherosclerosis of aorta: Secondary | ICD-10-CM | POA: Diagnosis not present

## 2022-06-28 DIAGNOSIS — M47814 Spondylosis without myelopathy or radiculopathy, thoracic region: Secondary | ICD-10-CM | POA: Diagnosis not present

## 2022-06-28 DIAGNOSIS — R208 Other disturbances of skin sensation: Secondary | ICD-10-CM | POA: Diagnosis not present

## 2022-06-28 MED ORDER — ONDANSETRON HCL 4 MG/2ML IJ SOLN: 4.0000 mg | Freq: Once | INTRAMUSCULAR | Status: DC | PRN

## 2022-06-28 MED ORDER — DIAZEPAM 5 MG PO TABS
5.0000 mg | ORAL_TABLET | Freq: Once | ORAL | Status: AC
  Administered 2022-06-28: 5 mg via ORAL

## 2022-06-28 MED ORDER — IOPAMIDOL (ISOVUE-M 300) INJECTION 61%
10.0000 mL | Freq: Once | INTRAMUSCULAR | Status: AC
  Administered 2022-06-28: 10 mL via INTRATHECAL

## 2022-06-28 MED ORDER — MEPERIDINE HCL 50 MG/ML IJ SOLN: 50.0000 mg | Freq: Once | INTRAMUSCULAR | Status: DC | PRN

## 2022-06-28 NOTE — Discharge Instructions (Signed)

## 2022-06-30 DIAGNOSIS — M4804 Spinal stenosis, thoracic region: Secondary | ICD-10-CM | POA: Diagnosis not present

## 2022-07-01 DIAGNOSIS — I519 Heart disease, unspecified: Secondary | ICD-10-CM | POA: Diagnosis not present

## 2022-07-01 DIAGNOSIS — Z951 Presence of aortocoronary bypass graft: Secondary | ICD-10-CM | POA: Diagnosis not present

## 2022-07-01 DIAGNOSIS — Z45018 Encounter for adjustment and management of other part of cardiac pacemaker: Secondary | ICD-10-CM | POA: Diagnosis not present

## 2022-07-13 DIAGNOSIS — K219 Gastro-esophageal reflux disease without esophagitis: Secondary | ICD-10-CM | POA: Diagnosis not present

## 2022-07-13 DIAGNOSIS — Z6824 Body mass index (BMI) 24.0-24.9, adult: Secondary | ICD-10-CM | POA: Diagnosis not present

## 2022-07-13 DIAGNOSIS — E538 Deficiency of other specified B group vitamins: Secondary | ICD-10-CM | POA: Diagnosis not present

## 2022-07-13 DIAGNOSIS — E785 Hyperlipidemia, unspecified: Secondary | ICD-10-CM | POA: Diagnosis not present

## 2022-07-13 DIAGNOSIS — R739 Hyperglycemia, unspecified: Secondary | ICD-10-CM | POA: Diagnosis not present

## 2022-07-13 DIAGNOSIS — Z1331 Encounter for screening for depression: Secondary | ICD-10-CM | POA: Diagnosis not present

## 2022-07-13 DIAGNOSIS — I10 Essential (primary) hypertension: Secondary | ICD-10-CM | POA: Diagnosis not present

## 2022-07-13 DIAGNOSIS — Z139 Encounter for screening, unspecified: Secondary | ICD-10-CM | POA: Diagnosis not present

## 2022-07-13 DIAGNOSIS — Z9181 History of falling: Secondary | ICD-10-CM | POA: Diagnosis not present

## 2022-07-16 DIAGNOSIS — R030 Elevated blood-pressure reading, without diagnosis of hypertension: Secondary | ICD-10-CM | POA: Diagnosis not present

## 2022-07-16 DIAGNOSIS — M25562 Pain in left knee: Secondary | ICD-10-CM | POA: Diagnosis not present

## 2022-07-16 DIAGNOSIS — M546 Pain in thoracic spine: Secondary | ICD-10-CM | POA: Diagnosis not present

## 2022-07-16 DIAGNOSIS — Z79891 Long term (current) use of opiate analgesic: Secondary | ICD-10-CM | POA: Diagnosis not present

## 2022-07-16 DIAGNOSIS — M545 Low back pain, unspecified: Secondary | ICD-10-CM | POA: Diagnosis not present

## 2022-07-16 DIAGNOSIS — I10 Essential (primary) hypertension: Secondary | ICD-10-CM | POA: Diagnosis not present

## 2022-07-16 DIAGNOSIS — Z9889 Other specified postprocedural states: Secondary | ICD-10-CM | POA: Diagnosis not present

## 2022-07-16 DIAGNOSIS — M5135 Other intervertebral disc degeneration, thoracolumbar region: Secondary | ICD-10-CM | POA: Diagnosis not present

## 2022-07-16 DIAGNOSIS — G8929 Other chronic pain: Secondary | ICD-10-CM | POA: Diagnosis not present

## 2022-07-16 DIAGNOSIS — Z79899 Other long term (current) drug therapy: Secondary | ICD-10-CM | POA: Diagnosis not present

## 2022-07-19 DIAGNOSIS — I361 Nonrheumatic tricuspid (valve) insufficiency: Secondary | ICD-10-CM | POA: Diagnosis not present

## 2022-07-19 DIAGNOSIS — I5189 Other ill-defined heart diseases: Secondary | ICD-10-CM | POA: Diagnosis not present

## 2022-07-19 DIAGNOSIS — I2510 Atherosclerotic heart disease of native coronary artery without angina pectoris: Secondary | ICD-10-CM | POA: Diagnosis not present

## 2022-08-14 DIAGNOSIS — M25562 Pain in left knee: Secondary | ICD-10-CM | POA: Diagnosis not present

## 2022-08-14 DIAGNOSIS — M5135 Other intervertebral disc degeneration, thoracolumbar region: Secondary | ICD-10-CM | POA: Diagnosis not present

## 2022-08-14 DIAGNOSIS — Z9889 Other specified postprocedural states: Secondary | ICD-10-CM | POA: Diagnosis not present

## 2022-08-14 DIAGNOSIS — Z79891 Long term (current) use of opiate analgesic: Secondary | ICD-10-CM | POA: Diagnosis not present

## 2022-08-14 DIAGNOSIS — Z79899 Other long term (current) drug therapy: Secondary | ICD-10-CM | POA: Diagnosis not present

## 2022-08-14 DIAGNOSIS — I10 Essential (primary) hypertension: Secondary | ICD-10-CM | POA: Diagnosis not present

## 2022-08-14 DIAGNOSIS — R030 Elevated blood-pressure reading, without diagnosis of hypertension: Secondary | ICD-10-CM | POA: Diagnosis not present

## 2022-08-14 DIAGNOSIS — M545 Low back pain, unspecified: Secondary | ICD-10-CM | POA: Diagnosis not present

## 2022-08-14 DIAGNOSIS — G8929 Other chronic pain: Secondary | ICD-10-CM | POA: Diagnosis not present

## 2022-08-14 DIAGNOSIS — M546 Pain in thoracic spine: Secondary | ICD-10-CM | POA: Diagnosis not present

## 2022-08-30 DIAGNOSIS — Z951 Presence of aortocoronary bypass graft: Secondary | ICD-10-CM | POA: Diagnosis not present

## 2022-08-30 DIAGNOSIS — I2510 Atherosclerotic heart disease of native coronary artery without angina pectoris: Secondary | ICD-10-CM | POA: Diagnosis not present

## 2022-08-30 DIAGNOSIS — Z79899 Other long term (current) drug therapy: Secondary | ICD-10-CM | POA: Diagnosis not present

## 2022-08-30 DIAGNOSIS — I519 Heart disease, unspecified: Secondary | ICD-10-CM | POA: Diagnosis not present

## 2022-08-30 DIAGNOSIS — E785 Hyperlipidemia, unspecified: Secondary | ICD-10-CM | POA: Diagnosis not present

## 2022-09-11 DIAGNOSIS — G8929 Other chronic pain: Secondary | ICD-10-CM | POA: Diagnosis not present

## 2022-09-11 DIAGNOSIS — Z79891 Long term (current) use of opiate analgesic: Secondary | ICD-10-CM | POA: Diagnosis not present

## 2022-09-11 DIAGNOSIS — M546 Pain in thoracic spine: Secondary | ICD-10-CM | POA: Diagnosis not present

## 2022-09-11 DIAGNOSIS — Z79899 Other long term (current) drug therapy: Secondary | ICD-10-CM | POA: Diagnosis not present

## 2022-09-11 DIAGNOSIS — M5135 Other intervertebral disc degeneration, thoracolumbar region: Secondary | ICD-10-CM | POA: Diagnosis not present

## 2022-09-11 DIAGNOSIS — Z9889 Other specified postprocedural states: Secondary | ICD-10-CM | POA: Diagnosis not present

## 2022-09-11 DIAGNOSIS — M25562 Pain in left knee: Secondary | ICD-10-CM | POA: Diagnosis not present

## 2022-09-11 DIAGNOSIS — M545 Low back pain, unspecified: Secondary | ICD-10-CM | POA: Diagnosis not present

## 2022-09-11 DIAGNOSIS — I10 Essential (primary) hypertension: Secondary | ICD-10-CM | POA: Diagnosis not present

## 2022-09-21 DIAGNOSIS — M7551 Bursitis of right shoulder: Secondary | ICD-10-CM | POA: Diagnosis not present

## 2022-10-12 DIAGNOSIS — R030 Elevated blood-pressure reading, without diagnosis of hypertension: Secondary | ICD-10-CM | POA: Diagnosis not present

## 2022-10-12 DIAGNOSIS — Z79899 Other long term (current) drug therapy: Secondary | ICD-10-CM | POA: Diagnosis not present

## 2022-10-12 DIAGNOSIS — M25562 Pain in left knee: Secondary | ICD-10-CM | POA: Diagnosis not present

## 2022-10-12 DIAGNOSIS — G8929 Other chronic pain: Secondary | ICD-10-CM | POA: Diagnosis not present

## 2022-10-12 DIAGNOSIS — M5135 Other intervertebral disc degeneration, thoracolumbar region: Secondary | ICD-10-CM | POA: Diagnosis not present

## 2022-10-12 DIAGNOSIS — Z79891 Long term (current) use of opiate analgesic: Secondary | ICD-10-CM | POA: Diagnosis not present

## 2022-10-12 DIAGNOSIS — M546 Pain in thoracic spine: Secondary | ICD-10-CM | POA: Diagnosis not present

## 2022-10-12 DIAGNOSIS — Z9889 Other specified postprocedural states: Secondary | ICD-10-CM | POA: Diagnosis not present

## 2022-10-12 DIAGNOSIS — M545 Low back pain, unspecified: Secondary | ICD-10-CM | POA: Diagnosis not present

## 2022-10-14 DIAGNOSIS — Z6824 Body mass index (BMI) 24.0-24.9, adult: Secondary | ICD-10-CM | POA: Diagnosis not present

## 2022-10-14 DIAGNOSIS — I10 Essential (primary) hypertension: Secondary | ICD-10-CM | POA: Diagnosis not present

## 2022-10-14 DIAGNOSIS — R739 Hyperglycemia, unspecified: Secondary | ICD-10-CM | POA: Diagnosis not present

## 2022-10-14 DIAGNOSIS — E785 Hyperlipidemia, unspecified: Secondary | ICD-10-CM | POA: Diagnosis not present

## 2022-10-14 DIAGNOSIS — E538 Deficiency of other specified B group vitamins: Secondary | ICD-10-CM | POA: Diagnosis not present

## 2022-10-14 DIAGNOSIS — K219 Gastro-esophageal reflux disease without esophagitis: Secondary | ICD-10-CM | POA: Diagnosis not present

## 2022-10-18 DIAGNOSIS — E871 Hypo-osmolality and hyponatremia: Secondary | ICD-10-CM | POA: Diagnosis not present

## 2022-10-18 DIAGNOSIS — E875 Hyperkalemia: Secondary | ICD-10-CM | POA: Diagnosis not present

## 2022-10-27 DIAGNOSIS — D649 Anemia, unspecified: Secondary | ICD-10-CM | POA: Diagnosis not present

## 2022-10-27 DIAGNOSIS — E871 Hypo-osmolality and hyponatremia: Secondary | ICD-10-CM | POA: Diagnosis not present

## 2022-11-10 DIAGNOSIS — E871 Hypo-osmolality and hyponatremia: Secondary | ICD-10-CM | POA: Diagnosis not present

## 2022-11-11 DIAGNOSIS — Z45018 Encounter for adjustment and management of other part of cardiac pacemaker: Secondary | ICD-10-CM | POA: Diagnosis not present

## 2022-11-12 DIAGNOSIS — G8929 Other chronic pain: Secondary | ICD-10-CM | POA: Diagnosis not present

## 2022-11-12 DIAGNOSIS — M5135 Other intervertebral disc degeneration, thoracolumbar region: Secondary | ICD-10-CM | POA: Diagnosis not present

## 2022-11-12 DIAGNOSIS — Z79891 Long term (current) use of opiate analgesic: Secondary | ICD-10-CM | POA: Diagnosis not present

## 2022-11-12 DIAGNOSIS — I10 Essential (primary) hypertension: Secondary | ICD-10-CM | POA: Diagnosis not present

## 2022-11-12 DIAGNOSIS — M546 Pain in thoracic spine: Secondary | ICD-10-CM | POA: Diagnosis not present

## 2022-11-12 DIAGNOSIS — M545 Low back pain, unspecified: Secondary | ICD-10-CM | POA: Diagnosis not present

## 2022-11-12 DIAGNOSIS — Z79899 Other long term (current) drug therapy: Secondary | ICD-10-CM | POA: Diagnosis not present

## 2022-11-12 DIAGNOSIS — M25562 Pain in left knee: Secondary | ICD-10-CM | POA: Diagnosis not present

## 2022-11-12 DIAGNOSIS — Z9889 Other specified postprocedural states: Secondary | ICD-10-CM | POA: Diagnosis not present

## 2022-12-13 DIAGNOSIS — Z79891 Long term (current) use of opiate analgesic: Secondary | ICD-10-CM | POA: Diagnosis not present

## 2022-12-13 DIAGNOSIS — Z9889 Other specified postprocedural states: Secondary | ICD-10-CM | POA: Diagnosis not present

## 2022-12-13 DIAGNOSIS — M545 Low back pain, unspecified: Secondary | ICD-10-CM | POA: Diagnosis not present

## 2022-12-13 DIAGNOSIS — R030 Elevated blood-pressure reading, without diagnosis of hypertension: Secondary | ICD-10-CM | POA: Diagnosis not present

## 2022-12-13 DIAGNOSIS — M546 Pain in thoracic spine: Secondary | ICD-10-CM | POA: Diagnosis not present

## 2022-12-13 DIAGNOSIS — I10 Essential (primary) hypertension: Secondary | ICD-10-CM | POA: Diagnosis not present

## 2022-12-13 DIAGNOSIS — G8929 Other chronic pain: Secondary | ICD-10-CM | POA: Diagnosis not present

## 2022-12-13 DIAGNOSIS — Z79899 Other long term (current) drug therapy: Secondary | ICD-10-CM | POA: Diagnosis not present

## 2022-12-13 DIAGNOSIS — M5135 Other intervertebral disc degeneration, thoracolumbar region: Secondary | ICD-10-CM | POA: Diagnosis not present

## 2022-12-13 DIAGNOSIS — M25562 Pain in left knee: Secondary | ICD-10-CM | POA: Diagnosis not present

## 2022-12-16 DIAGNOSIS — Z79899 Other long term (current) drug therapy: Secondary | ICD-10-CM | POA: Diagnosis not present

## 2023-01-12 DIAGNOSIS — M25562 Pain in left knee: Secondary | ICD-10-CM | POA: Diagnosis not present

## 2023-01-12 DIAGNOSIS — I10 Essential (primary) hypertension: Secondary | ICD-10-CM | POA: Diagnosis not present

## 2023-01-12 DIAGNOSIS — Z9889 Other specified postprocedural states: Secondary | ICD-10-CM | POA: Diagnosis not present

## 2023-01-12 DIAGNOSIS — Z79891 Long term (current) use of opiate analgesic: Secondary | ICD-10-CM | POA: Diagnosis not present

## 2023-01-12 DIAGNOSIS — M5135 Other intervertebral disc degeneration, thoracolumbar region: Secondary | ICD-10-CM | POA: Diagnosis not present

## 2023-01-12 DIAGNOSIS — R030 Elevated blood-pressure reading, without diagnosis of hypertension: Secondary | ICD-10-CM | POA: Diagnosis not present

## 2023-01-12 DIAGNOSIS — M545 Low back pain, unspecified: Secondary | ICD-10-CM | POA: Diagnosis not present

## 2023-01-12 DIAGNOSIS — G8929 Other chronic pain: Secondary | ICD-10-CM | POA: Diagnosis not present

## 2023-01-12 DIAGNOSIS — Z79899 Other long term (current) drug therapy: Secondary | ICD-10-CM | POA: Diagnosis not present

## 2023-01-12 DIAGNOSIS — M546 Pain in thoracic spine: Secondary | ICD-10-CM | POA: Diagnosis not present

## 2023-01-14 DIAGNOSIS — Z79899 Other long term (current) drug therapy: Secondary | ICD-10-CM | POA: Diagnosis not present

## 2023-01-18 DIAGNOSIS — E785 Hyperlipidemia, unspecified: Secondary | ICD-10-CM | POA: Diagnosis not present

## 2023-01-18 DIAGNOSIS — I10 Essential (primary) hypertension: Secondary | ICD-10-CM | POA: Diagnosis not present

## 2023-01-18 DIAGNOSIS — Z23 Encounter for immunization: Secondary | ICD-10-CM | POA: Diagnosis not present

## 2023-01-18 DIAGNOSIS — J40 Bronchitis, not specified as acute or chronic: Secondary | ICD-10-CM | POA: Diagnosis not present

## 2023-01-18 DIAGNOSIS — Z6825 Body mass index (BMI) 25.0-25.9, adult: Secondary | ICD-10-CM | POA: Diagnosis not present

## 2023-01-18 DIAGNOSIS — G8929 Other chronic pain: Secondary | ICD-10-CM | POA: Diagnosis not present

## 2023-01-18 DIAGNOSIS — R739 Hyperglycemia, unspecified: Secondary | ICD-10-CM | POA: Diagnosis not present

## 2023-01-18 DIAGNOSIS — M545 Low back pain, unspecified: Secondary | ICD-10-CM | POA: Diagnosis not present

## 2023-01-18 DIAGNOSIS — Z125 Encounter for screening for malignant neoplasm of prostate: Secondary | ICD-10-CM | POA: Diagnosis not present

## 2023-02-03 DIAGNOSIS — Z45018 Encounter for adjustment and management of other part of cardiac pacemaker: Secondary | ICD-10-CM | POA: Diagnosis not present

## 2023-02-03 DIAGNOSIS — I442 Atrioventricular block, complete: Secondary | ICD-10-CM | POA: Diagnosis not present

## 2023-02-11 DIAGNOSIS — M5135 Other intervertebral disc degeneration, thoracolumbar region: Secondary | ICD-10-CM | POA: Diagnosis not present

## 2023-02-11 DIAGNOSIS — I10 Essential (primary) hypertension: Secondary | ICD-10-CM | POA: Diagnosis not present

## 2023-02-11 DIAGNOSIS — M546 Pain in thoracic spine: Secondary | ICD-10-CM | POA: Diagnosis not present

## 2023-02-11 DIAGNOSIS — M545 Low back pain, unspecified: Secondary | ICD-10-CM | POA: Diagnosis not present

## 2023-02-11 DIAGNOSIS — Z79891 Long term (current) use of opiate analgesic: Secondary | ICD-10-CM | POA: Diagnosis not present

## 2023-02-11 DIAGNOSIS — Z9889 Other specified postprocedural states: Secondary | ICD-10-CM | POA: Diagnosis not present

## 2023-02-11 DIAGNOSIS — G8929 Other chronic pain: Secondary | ICD-10-CM | POA: Diagnosis not present

## 2023-02-11 DIAGNOSIS — M25562 Pain in left knee: Secondary | ICD-10-CM | POA: Diagnosis not present

## 2023-02-11 DIAGNOSIS — Z79899 Other long term (current) drug therapy: Secondary | ICD-10-CM | POA: Diagnosis not present

## 2023-02-16 DIAGNOSIS — Z79899 Other long term (current) drug therapy: Secondary | ICD-10-CM | POA: Diagnosis not present

## 2023-02-21 DIAGNOSIS — Z139 Encounter for screening, unspecified: Secondary | ICD-10-CM | POA: Diagnosis not present

## 2023-02-21 DIAGNOSIS — Z9181 History of falling: Secondary | ICD-10-CM | POA: Diagnosis not present

## 2023-02-21 DIAGNOSIS — Z0000 Encounter for general adult medical examination without abnormal findings: Secondary | ICD-10-CM | POA: Diagnosis not present

## 2023-03-14 DIAGNOSIS — M545 Low back pain, unspecified: Secondary | ICD-10-CM | POA: Diagnosis not present

## 2023-03-14 DIAGNOSIS — I10 Essential (primary) hypertension: Secondary | ICD-10-CM | POA: Diagnosis not present

## 2023-03-14 DIAGNOSIS — Z79899 Other long term (current) drug therapy: Secondary | ICD-10-CM | POA: Diagnosis not present

## 2023-03-14 DIAGNOSIS — G8929 Other chronic pain: Secondary | ICD-10-CM | POA: Diagnosis not present

## 2023-03-14 DIAGNOSIS — M5135 Other intervertebral disc degeneration, thoracolumbar region: Secondary | ICD-10-CM | POA: Diagnosis not present

## 2023-03-14 DIAGNOSIS — M546 Pain in thoracic spine: Secondary | ICD-10-CM | POA: Diagnosis not present

## 2023-03-14 DIAGNOSIS — Z9889 Other specified postprocedural states: Secondary | ICD-10-CM | POA: Diagnosis not present

## 2023-03-14 DIAGNOSIS — Z79891 Long term (current) use of opiate analgesic: Secondary | ICD-10-CM | POA: Diagnosis not present

## 2023-03-14 DIAGNOSIS — M25562 Pain in left knee: Secondary | ICD-10-CM | POA: Diagnosis not present

## 2023-03-17 DIAGNOSIS — M4804 Spinal stenosis, thoracic region: Secondary | ICD-10-CM | POA: Diagnosis not present

## 2023-03-18 DIAGNOSIS — Z79899 Other long term (current) drug therapy: Secondary | ICD-10-CM | POA: Diagnosis not present

## 2023-04-11 DIAGNOSIS — M546 Pain in thoracic spine: Secondary | ICD-10-CM | POA: Diagnosis not present

## 2023-04-11 DIAGNOSIS — M25562 Pain in left knee: Secondary | ICD-10-CM | POA: Diagnosis not present

## 2023-04-11 DIAGNOSIS — Z79899 Other long term (current) drug therapy: Secondary | ICD-10-CM | POA: Diagnosis not present

## 2023-04-11 DIAGNOSIS — Z9889 Other specified postprocedural states: Secondary | ICD-10-CM | POA: Diagnosis not present

## 2023-04-11 DIAGNOSIS — Z79891 Long term (current) use of opiate analgesic: Secondary | ICD-10-CM | POA: Diagnosis not present

## 2023-04-11 DIAGNOSIS — G8929 Other chronic pain: Secondary | ICD-10-CM | POA: Diagnosis not present

## 2023-04-11 DIAGNOSIS — R030 Elevated blood-pressure reading, without diagnosis of hypertension: Secondary | ICD-10-CM | POA: Diagnosis not present

## 2023-04-11 DIAGNOSIS — M545 Low back pain, unspecified: Secondary | ICD-10-CM | POA: Diagnosis not present

## 2023-04-11 DIAGNOSIS — M5135 Other intervertebral disc degeneration, thoracolumbar region: Secondary | ICD-10-CM | POA: Diagnosis not present

## 2023-04-13 DIAGNOSIS — M4804 Spinal stenosis, thoracic region: Secondary | ICD-10-CM | POA: Diagnosis not present

## 2023-04-13 DIAGNOSIS — Z9889 Other specified postprocedural states: Secondary | ICD-10-CM | POA: Diagnosis not present

## 2023-04-14 DIAGNOSIS — Z79899 Other long term (current) drug therapy: Secondary | ICD-10-CM | POA: Diagnosis not present

## 2023-04-20 ENCOUNTER — Other Ambulatory Visit (HOSPITAL_BASED_OUTPATIENT_CLINIC_OR_DEPARTMENT_OTHER): Payer: Self-pay | Admitting: Pain Medicine

## 2023-04-20 ENCOUNTER — Other Ambulatory Visit (HOSPITAL_COMMUNITY): Payer: Self-pay | Admitting: Pain Medicine

## 2023-04-20 DIAGNOSIS — M48062 Spinal stenosis, lumbar region with neurogenic claudication: Secondary | ICD-10-CM

## 2023-04-20 DIAGNOSIS — E538 Deficiency of other specified B group vitamins: Secondary | ICD-10-CM | POA: Diagnosis not present

## 2023-04-20 DIAGNOSIS — G8929 Other chronic pain: Secondary | ICD-10-CM | POA: Diagnosis not present

## 2023-04-20 DIAGNOSIS — Z6824 Body mass index (BMI) 24.0-24.9, adult: Secondary | ICD-10-CM | POA: Diagnosis not present

## 2023-04-20 DIAGNOSIS — E785 Hyperlipidemia, unspecified: Secondary | ICD-10-CM | POA: Diagnosis not present

## 2023-04-20 DIAGNOSIS — R739 Hyperglycemia, unspecified: Secondary | ICD-10-CM | POA: Diagnosis not present

## 2023-04-20 DIAGNOSIS — I10 Essential (primary) hypertension: Secondary | ICD-10-CM | POA: Diagnosis not present

## 2023-04-20 DIAGNOSIS — M545 Low back pain, unspecified: Secondary | ICD-10-CM | POA: Diagnosis not present

## 2023-04-20 DIAGNOSIS — K219 Gastro-esophageal reflux disease without esophagitis: Secondary | ICD-10-CM | POA: Diagnosis not present

## 2023-04-25 ENCOUNTER — Other Ambulatory Visit (HOSPITAL_BASED_OUTPATIENT_CLINIC_OR_DEPARTMENT_OTHER): Payer: Self-pay | Admitting: Pain Medicine

## 2023-04-25 DIAGNOSIS — M48062 Spinal stenosis, lumbar region with neurogenic claudication: Secondary | ICD-10-CM

## 2023-04-27 DIAGNOSIS — Z122 Encounter for screening for malignant neoplasm of respiratory organs: Secondary | ICD-10-CM | POA: Diagnosis not present

## 2023-04-27 DIAGNOSIS — Z87891 Personal history of nicotine dependence: Secondary | ICD-10-CM | POA: Diagnosis not present

## 2023-04-29 ENCOUNTER — Other Ambulatory Visit: Payer: Self-pay | Admitting: Pain Medicine

## 2023-04-29 DIAGNOSIS — M4804 Spinal stenosis, thoracic region: Secondary | ICD-10-CM

## 2023-04-29 DIAGNOSIS — M48062 Spinal stenosis, lumbar region with neurogenic claudication: Secondary | ICD-10-CM

## 2023-04-29 DIAGNOSIS — Z87891 Personal history of nicotine dependence: Secondary | ICD-10-CM | POA: Diagnosis not present

## 2023-05-02 DIAGNOSIS — I519 Heart disease, unspecified: Secondary | ICD-10-CM | POA: Diagnosis not present

## 2023-05-02 DIAGNOSIS — Z951 Presence of aortocoronary bypass graft: Secondary | ICD-10-CM | POA: Diagnosis not present

## 2023-05-02 DIAGNOSIS — I158 Other secondary hypertension: Secondary | ICD-10-CM | POA: Diagnosis not present

## 2023-05-02 DIAGNOSIS — E785 Hyperlipidemia, unspecified: Secondary | ICD-10-CM | POA: Diagnosis not present

## 2023-05-02 DIAGNOSIS — Z45018 Encounter for adjustment and management of other part of cardiac pacemaker: Secondary | ICD-10-CM | POA: Diagnosis not present

## 2023-05-02 DIAGNOSIS — I2510 Atherosclerotic heart disease of native coronary artery without angina pectoris: Secondary | ICD-10-CM | POA: Diagnosis not present

## 2023-05-05 DIAGNOSIS — Z45010 Encounter for checking and testing of cardiac pacemaker pulse generator [battery]: Secondary | ICD-10-CM | POA: Diagnosis not present

## 2023-05-06 NOTE — Discharge Instructions (Signed)

## 2023-05-09 ENCOUNTER — Ambulatory Visit
Admission: RE | Admit: 2023-05-09 | Discharge: 2023-05-09 | Disposition: A | Discharge status: Home / Self Care | Source: Ambulatory Visit | Attending: Pain Medicine | Admitting: Pain Medicine

## 2023-05-09 DIAGNOSIS — M5134 Other intervertebral disc degeneration, thoracic region: Secondary | ICD-10-CM | POA: Diagnosis not present

## 2023-05-09 DIAGNOSIS — M4805 Spinal stenosis, thoracolumbar region: Secondary | ICD-10-CM | POA: Diagnosis not present

## 2023-05-09 DIAGNOSIS — M4804 Spinal stenosis, thoracic region: Secondary | ICD-10-CM

## 2023-05-09 DIAGNOSIS — M48062 Spinal stenosis, lumbar region with neurogenic claudication: Secondary | ICD-10-CM

## 2023-05-09 DIAGNOSIS — Z981 Arthrodesis status: Secondary | ICD-10-CM | POA: Diagnosis not present

## 2023-05-09 DIAGNOSIS — M47814 Spondylosis without myelopathy or radiculopathy, thoracic region: Secondary | ICD-10-CM | POA: Diagnosis not present

## 2023-05-09 DIAGNOSIS — M5124 Other intervertebral disc displacement, thoracic region: Secondary | ICD-10-CM | POA: Diagnosis not present

## 2023-05-09 MED ORDER — ONDANSETRON HCL 4 MG/2ML IJ SOLN: 4.0000 mg | Freq: Once | INTRAMUSCULAR | Status: DC | PRN

## 2023-05-09 MED ORDER — MEPERIDINE HCL 50 MG/ML IJ SOLN: 50.0000 mg | Freq: Once | INTRAMUSCULAR | Status: DC | PRN

## 2023-05-09 MED ORDER — DIAZEPAM 5 MG PO TABS: 5.0000 mg | ORAL_TABLET | Freq: Once | ORAL | Status: DC

## 2023-05-09 MED ORDER — IOPAMIDOL (ISOVUE-M 300) INJECTION 61%
10.0000 mL | Freq: Once | INTRAMUSCULAR | Status: AC | PRN
  Administered 2023-05-09: 10 mL via INTRATHECAL

## 2023-05-11 DIAGNOSIS — G8929 Other chronic pain: Secondary | ICD-10-CM | POA: Diagnosis not present

## 2023-05-11 DIAGNOSIS — Z79899 Other long term (current) drug therapy: Secondary | ICD-10-CM | POA: Diagnosis not present

## 2023-05-11 DIAGNOSIS — R030 Elevated blood-pressure reading, without diagnosis of hypertension: Secondary | ICD-10-CM | POA: Diagnosis not present

## 2023-05-11 DIAGNOSIS — M5135 Other intervertebral disc degeneration, thoracolumbar region: Secondary | ICD-10-CM | POA: Diagnosis not present

## 2023-05-11 DIAGNOSIS — Z9889 Other specified postprocedural states: Secondary | ICD-10-CM | POA: Diagnosis not present

## 2023-05-11 DIAGNOSIS — Z79891 Long term (current) use of opiate analgesic: Secondary | ICD-10-CM | POA: Diagnosis not present

## 2023-05-11 DIAGNOSIS — M545 Low back pain, unspecified: Secondary | ICD-10-CM | POA: Diagnosis not present

## 2023-05-11 DIAGNOSIS — M546 Pain in thoracic spine: Secondary | ICD-10-CM | POA: Diagnosis not present

## 2023-05-11 DIAGNOSIS — M25562 Pain in left knee: Secondary | ICD-10-CM | POA: Diagnosis not present

## 2023-05-13 DIAGNOSIS — Z79899 Other long term (current) drug therapy: Secondary | ICD-10-CM | POA: Diagnosis not present

## 2023-06-10 DIAGNOSIS — Z9889 Other specified postprocedural states: Secondary | ICD-10-CM | POA: Diagnosis not present

## 2023-06-10 DIAGNOSIS — I10 Essential (primary) hypertension: Secondary | ICD-10-CM | POA: Diagnosis not present

## 2023-06-10 DIAGNOSIS — G8929 Other chronic pain: Secondary | ICD-10-CM | POA: Diagnosis not present

## 2023-06-10 DIAGNOSIS — Z79891 Long term (current) use of opiate analgesic: Secondary | ICD-10-CM | POA: Diagnosis not present

## 2023-06-10 DIAGNOSIS — M546 Pain in thoracic spine: Secondary | ICD-10-CM | POA: Diagnosis not present

## 2023-06-10 DIAGNOSIS — Z79899 Other long term (current) drug therapy: Secondary | ICD-10-CM | POA: Diagnosis not present

## 2023-06-10 DIAGNOSIS — M545 Low back pain, unspecified: Secondary | ICD-10-CM | POA: Diagnosis not present

## 2023-06-10 DIAGNOSIS — M5135 Other intervertebral disc degeneration, thoracolumbar region: Secondary | ICD-10-CM | POA: Diagnosis not present

## 2023-06-10 DIAGNOSIS — M25562 Pain in left knee: Secondary | ICD-10-CM | POA: Diagnosis not present

## 2023-06-15 DIAGNOSIS — Z79899 Other long term (current) drug therapy: Secondary | ICD-10-CM | POA: Diagnosis not present

## 2023-06-30 DIAGNOSIS — Z45018 Encounter for adjustment and management of other part of cardiac pacemaker: Secondary | ICD-10-CM | POA: Diagnosis not present

## 2023-06-30 DIAGNOSIS — Z950 Presence of cardiac pacemaker: Secondary | ICD-10-CM | POA: Diagnosis not present

## 2023-06-30 DIAGNOSIS — I119 Hypertensive heart disease without heart failure: Secondary | ICD-10-CM | POA: Diagnosis not present

## 2023-07-11 DIAGNOSIS — Z79891 Long term (current) use of opiate analgesic: Secondary | ICD-10-CM | POA: Diagnosis not present

## 2023-07-11 DIAGNOSIS — M546 Pain in thoracic spine: Secondary | ICD-10-CM | POA: Diagnosis not present

## 2023-07-11 DIAGNOSIS — Z79899 Other long term (current) drug therapy: Secondary | ICD-10-CM | POA: Diagnosis not present

## 2023-07-11 DIAGNOSIS — M5135 Other intervertebral disc degeneration, thoracolumbar region: Secondary | ICD-10-CM | POA: Diagnosis not present

## 2023-07-11 DIAGNOSIS — M25562 Pain in left knee: Secondary | ICD-10-CM | POA: Diagnosis not present

## 2023-07-11 DIAGNOSIS — G8929 Other chronic pain: Secondary | ICD-10-CM | POA: Diagnosis not present

## 2023-07-11 DIAGNOSIS — I10 Essential (primary) hypertension: Secondary | ICD-10-CM | POA: Diagnosis not present

## 2023-07-11 DIAGNOSIS — Z9889 Other specified postprocedural states: Secondary | ICD-10-CM | POA: Diagnosis not present

## 2023-07-15 DIAGNOSIS — Z79899 Other long term (current) drug therapy: Secondary | ICD-10-CM | POA: Diagnosis not present

## 2023-07-25 DIAGNOSIS — E559 Vitamin D deficiency, unspecified: Secondary | ICD-10-CM | POA: Diagnosis not present

## 2023-07-25 DIAGNOSIS — Z6824 Body mass index (BMI) 24.0-24.9, adult: Secondary | ICD-10-CM | POA: Diagnosis not present

## 2023-07-25 DIAGNOSIS — K219 Gastro-esophageal reflux disease without esophagitis: Secondary | ICD-10-CM | POA: Diagnosis not present

## 2023-07-25 DIAGNOSIS — I10 Essential (primary) hypertension: Secondary | ICD-10-CM | POA: Diagnosis not present

## 2023-07-25 DIAGNOSIS — J40 Bronchitis, not specified as acute or chronic: Secondary | ICD-10-CM | POA: Diagnosis not present

## 2023-07-25 DIAGNOSIS — K5900 Constipation, unspecified: Secondary | ICD-10-CM | POA: Diagnosis not present

## 2023-07-25 DIAGNOSIS — R739 Hyperglycemia, unspecified: Secondary | ICD-10-CM | POA: Diagnosis not present

## 2023-07-25 DIAGNOSIS — G8929 Other chronic pain: Secondary | ICD-10-CM | POA: Diagnosis not present

## 2023-07-25 DIAGNOSIS — E785 Hyperlipidemia, unspecified: Secondary | ICD-10-CM | POA: Diagnosis not present

## 2023-07-25 DIAGNOSIS — M545 Low back pain, unspecified: Secondary | ICD-10-CM | POA: Diagnosis not present

## 2023-08-09 DIAGNOSIS — I10 Essential (primary) hypertension: Secondary | ICD-10-CM | POA: Diagnosis not present

## 2023-08-09 DIAGNOSIS — M25562 Pain in left knee: Secondary | ICD-10-CM | POA: Diagnosis not present

## 2023-08-09 DIAGNOSIS — Z9889 Other specified postprocedural states: Secondary | ICD-10-CM | POA: Diagnosis not present

## 2023-08-09 DIAGNOSIS — M546 Pain in thoracic spine: Secondary | ICD-10-CM | POA: Diagnosis not present

## 2023-08-09 DIAGNOSIS — G8929 Other chronic pain: Secondary | ICD-10-CM | POA: Diagnosis not present

## 2023-08-09 DIAGNOSIS — M545 Low back pain, unspecified: Secondary | ICD-10-CM | POA: Diagnosis not present

## 2023-08-09 DIAGNOSIS — Z79899 Other long term (current) drug therapy: Secondary | ICD-10-CM | POA: Diagnosis not present

## 2023-08-09 DIAGNOSIS — M5135 Other intervertebral disc degeneration, thoracolumbar region: Secondary | ICD-10-CM | POA: Diagnosis not present

## 2023-08-09 DIAGNOSIS — Z79891 Long term (current) use of opiate analgesic: Secondary | ICD-10-CM | POA: Diagnosis not present

## 2023-08-15 DIAGNOSIS — Z79899 Other long term (current) drug therapy: Secondary | ICD-10-CM | POA: Diagnosis not present

## 2023-09-09 DIAGNOSIS — I10 Essential (primary) hypertension: Secondary | ICD-10-CM | POA: Diagnosis not present

## 2023-09-09 DIAGNOSIS — M25562 Pain in left knee: Secondary | ICD-10-CM | POA: Diagnosis not present

## 2023-09-09 DIAGNOSIS — M545 Low back pain, unspecified: Secondary | ICD-10-CM | POA: Diagnosis not present

## 2023-09-09 DIAGNOSIS — Z79899 Other long term (current) drug therapy: Secondary | ICD-10-CM | POA: Diagnosis not present

## 2023-09-09 DIAGNOSIS — M129 Arthropathy, unspecified: Secondary | ICD-10-CM | POA: Diagnosis not present

## 2023-09-09 DIAGNOSIS — M546 Pain in thoracic spine: Secondary | ICD-10-CM | POA: Diagnosis not present

## 2023-09-09 DIAGNOSIS — M5135 Other intervertebral disc degeneration, thoracolumbar region: Secondary | ICD-10-CM | POA: Diagnosis not present

## 2023-09-09 DIAGNOSIS — Z0000 Encounter for general adult medical examination without abnormal findings: Secondary | ICD-10-CM | POA: Diagnosis not present

## 2023-09-09 DIAGNOSIS — Z79891 Long term (current) use of opiate analgesic: Secondary | ICD-10-CM | POA: Diagnosis not present

## 2023-09-09 DIAGNOSIS — R5383 Other fatigue: Secondary | ICD-10-CM | POA: Diagnosis not present

## 2023-09-09 DIAGNOSIS — G8929 Other chronic pain: Secondary | ICD-10-CM | POA: Diagnosis not present

## 2023-09-09 DIAGNOSIS — E559 Vitamin D deficiency, unspecified: Secondary | ICD-10-CM | POA: Diagnosis not present

## 2023-09-14 DIAGNOSIS — Z79899 Other long term (current) drug therapy: Secondary | ICD-10-CM | POA: Diagnosis not present

## 2023-10-05 DIAGNOSIS — Z6824 Body mass index (BMI) 24.0-24.9, adult: Secondary | ICD-10-CM | POA: Diagnosis not present

## 2023-10-05 DIAGNOSIS — Z1211 Encounter for screening for malignant neoplasm of colon: Secondary | ICD-10-CM | POA: Diagnosis not present

## 2023-10-05 DIAGNOSIS — R197 Diarrhea, unspecified: Secondary | ICD-10-CM | POA: Diagnosis not present

## 2023-10-05 DIAGNOSIS — K529 Noninfective gastroenteritis and colitis, unspecified: Secondary | ICD-10-CM | POA: Diagnosis not present

## 2023-10-06 DIAGNOSIS — Z79899 Other long term (current) drug therapy: Secondary | ICD-10-CM | POA: Diagnosis not present

## 2023-10-06 DIAGNOSIS — M546 Pain in thoracic spine: Secondary | ICD-10-CM | POA: Diagnosis not present

## 2023-10-06 DIAGNOSIS — Z79891 Long term (current) use of opiate analgesic: Secondary | ICD-10-CM | POA: Diagnosis not present

## 2023-10-06 DIAGNOSIS — Z9889 Other specified postprocedural states: Secondary | ICD-10-CM | POA: Diagnosis not present

## 2023-10-06 DIAGNOSIS — M5135 Other intervertebral disc degeneration, thoracolumbar region: Secondary | ICD-10-CM | POA: Diagnosis not present

## 2023-10-06 DIAGNOSIS — G8929 Other chronic pain: Secondary | ICD-10-CM | POA: Diagnosis not present

## 2023-10-06 DIAGNOSIS — M25562 Pain in left knee: Secondary | ICD-10-CM | POA: Diagnosis not present

## 2023-10-06 DIAGNOSIS — I10 Essential (primary) hypertension: Secondary | ICD-10-CM | POA: Diagnosis not present

## 2023-10-06 DIAGNOSIS — M545 Low back pain, unspecified: Secondary | ICD-10-CM | POA: Diagnosis not present

## 2023-10-11 DIAGNOSIS — Z79899 Other long term (current) drug therapy: Secondary | ICD-10-CM | POA: Diagnosis not present

## 2023-10-26 DIAGNOSIS — N289 Disorder of kidney and ureter, unspecified: Secondary | ICD-10-CM | POA: Diagnosis not present

## 2023-10-31 DIAGNOSIS — R197 Diarrhea, unspecified: Secondary | ICD-10-CM | POA: Diagnosis not present

## 2023-11-01 DIAGNOSIS — Z950 Presence of cardiac pacemaker: Secondary | ICD-10-CM | POA: Diagnosis not present

## 2023-11-07 DIAGNOSIS — Z6825 Body mass index (BMI) 25.0-25.9, adult: Secondary | ICD-10-CM | POA: Diagnosis not present

## 2023-11-07 DIAGNOSIS — M5135 Other intervertebral disc degeneration, thoracolumbar region: Secondary | ICD-10-CM | POA: Diagnosis not present

## 2023-11-07 DIAGNOSIS — F1120 Opioid dependence, uncomplicated: Secondary | ICD-10-CM | POA: Diagnosis not present

## 2023-11-07 DIAGNOSIS — Z79899 Other long term (current) drug therapy: Secondary | ICD-10-CM | POA: Diagnosis not present

## 2023-11-09 DIAGNOSIS — Z79899 Other long term (current) drug therapy: Secondary | ICD-10-CM | POA: Diagnosis not present

## 2023-11-24 DIAGNOSIS — K219 Gastro-esophageal reflux disease without esophagitis: Secondary | ICD-10-CM | POA: Diagnosis not present

## 2023-11-24 DIAGNOSIS — E790 Hyperuricemia without signs of inflammatory arthritis and tophaceous disease: Secondary | ICD-10-CM | POA: Diagnosis not present

## 2023-11-24 DIAGNOSIS — I2510 Atherosclerotic heart disease of native coronary artery without angina pectoris: Secondary | ICD-10-CM | POA: Diagnosis not present

## 2023-11-24 DIAGNOSIS — Z125 Encounter for screening for malignant neoplasm of prostate: Secondary | ICD-10-CM | POA: Diagnosis not present

## 2023-11-24 DIAGNOSIS — Z950 Presence of cardiac pacemaker: Secondary | ICD-10-CM | POA: Diagnosis not present

## 2023-11-24 DIAGNOSIS — E785 Hyperlipidemia, unspecified: Secondary | ICD-10-CM | POA: Diagnosis not present

## 2023-11-24 DIAGNOSIS — R739 Hyperglycemia, unspecified: Secondary | ICD-10-CM | POA: Diagnosis not present

## 2023-11-24 DIAGNOSIS — I10 Essential (primary) hypertension: Secondary | ICD-10-CM | POA: Diagnosis not present

## 2023-11-24 DIAGNOSIS — E538 Deficiency of other specified B group vitamins: Secondary | ICD-10-CM | POA: Diagnosis not present

## 2023-11-24 DIAGNOSIS — Z23 Encounter for immunization: Secondary | ICD-10-CM | POA: Diagnosis not present

## 2023-12-07 DIAGNOSIS — R030 Elevated blood-pressure reading, without diagnosis of hypertension: Secondary | ICD-10-CM | POA: Diagnosis not present

## 2023-12-07 DIAGNOSIS — M5135 Other intervertebral disc degeneration, thoracolumbar region: Secondary | ICD-10-CM | POA: Diagnosis not present

## 2023-12-07 DIAGNOSIS — Z79899 Other long term (current) drug therapy: Secondary | ICD-10-CM | POA: Diagnosis not present

## 2023-12-07 DIAGNOSIS — F1120 Opioid dependence, uncomplicated: Secondary | ICD-10-CM | POA: Diagnosis not present

## 2023-12-07 DIAGNOSIS — Z87891 Personal history of nicotine dependence: Secondary | ICD-10-CM | POA: Diagnosis not present

## 2023-12-07 DIAGNOSIS — Z136 Encounter for screening for cardiovascular disorders: Secondary | ICD-10-CM | POA: Diagnosis not present

## 2023-12-07 DIAGNOSIS — F17210 Nicotine dependence, cigarettes, uncomplicated: Secondary | ICD-10-CM | POA: Diagnosis not present

## 2023-12-07 DIAGNOSIS — Z0000 Encounter for general adult medical examination without abnormal findings: Secondary | ICD-10-CM | POA: Diagnosis not present

## 2023-12-07 DIAGNOSIS — Z79891 Long term (current) use of opiate analgesic: Secondary | ICD-10-CM | POA: Diagnosis not present

## 2023-12-07 DIAGNOSIS — Z6825 Body mass index (BMI) 25.0-25.9, adult: Secondary | ICD-10-CM | POA: Diagnosis not present

## 2023-12-08 DIAGNOSIS — Z6826 Body mass index (BMI) 26.0-26.9, adult: Secondary | ICD-10-CM | POA: Diagnosis not present

## 2023-12-08 DIAGNOSIS — M4802 Spinal stenosis, cervical region: Secondary | ICD-10-CM | POA: Diagnosis not present

## 2023-12-08 DIAGNOSIS — M4804 Spinal stenosis, thoracic region: Secondary | ICD-10-CM | POA: Diagnosis not present

## 2023-12-14 DIAGNOSIS — M4804 Spinal stenosis, thoracic region: Secondary | ICD-10-CM | POA: Diagnosis not present

## 2023-12-14 DIAGNOSIS — M4802 Spinal stenosis, cervical region: Secondary | ICD-10-CM | POA: Diagnosis not present

## 2023-12-14 DIAGNOSIS — M48062 Spinal stenosis, lumbar region with neurogenic claudication: Secondary | ICD-10-CM | POA: Diagnosis not present

## 2023-12-15 DIAGNOSIS — M47812 Spondylosis without myelopathy or radiculopathy, cervical region: Secondary | ICD-10-CM | POA: Diagnosis not present

## 2023-12-15 DIAGNOSIS — M48062 Spinal stenosis, lumbar region with neurogenic claudication: Secondary | ICD-10-CM | POA: Diagnosis not present

## 2023-12-15 DIAGNOSIS — M5135 Other intervertebral disc degeneration, thoracolumbar region: Secondary | ICD-10-CM | POA: Diagnosis not present

## 2023-12-15 DIAGNOSIS — Z981 Arthrodesis status: Secondary | ICD-10-CM | POA: Diagnosis not present

## 2023-12-20 DIAGNOSIS — K219 Gastro-esophageal reflux disease without esophagitis: Secondary | ICD-10-CM | POA: Diagnosis not present

## 2023-12-30 DIAGNOSIS — I2510 Atherosclerotic heart disease of native coronary artery without angina pectoris: Secondary | ICD-10-CM | POA: Diagnosis not present

## 2023-12-30 DIAGNOSIS — E785 Hyperlipidemia, unspecified: Secondary | ICD-10-CM | POA: Diagnosis not present

## 2023-12-30 DIAGNOSIS — I10 Essential (primary) hypertension: Secondary | ICD-10-CM | POA: Diagnosis not present

## 2023-12-30 DIAGNOSIS — Z720 Tobacco use: Secondary | ICD-10-CM | POA: Diagnosis not present

## 2023-12-30 DIAGNOSIS — Z45018 Encounter for adjustment and management of other part of cardiac pacemaker: Secondary | ICD-10-CM | POA: Diagnosis not present

## 2023-12-30 DIAGNOSIS — R0609 Other forms of dyspnea: Secondary | ICD-10-CM | POA: Diagnosis not present

## 2023-12-30 DIAGNOSIS — Z951 Presence of aortocoronary bypass graft: Secondary | ICD-10-CM | POA: Diagnosis not present

## 2024-01-02 DIAGNOSIS — Z6825 Body mass index (BMI) 25.0-25.9, adult: Secondary | ICD-10-CM | POA: Diagnosis not present

## 2024-01-02 DIAGNOSIS — M4802 Spinal stenosis, cervical region: Secondary | ICD-10-CM | POA: Diagnosis not present

## 2024-01-03 DIAGNOSIS — M5135 Other intervertebral disc degeneration, thoracolumbar region: Secondary | ICD-10-CM | POA: Diagnosis not present

## 2024-01-03 DIAGNOSIS — R030 Elevated blood-pressure reading, without diagnosis of hypertension: Secondary | ICD-10-CM | POA: Diagnosis not present

## 2024-01-03 DIAGNOSIS — I10 Essential (primary) hypertension: Secondary | ICD-10-CM | POA: Diagnosis not present

## 2024-01-03 DIAGNOSIS — Z79891 Long term (current) use of opiate analgesic: Secondary | ICD-10-CM | POA: Diagnosis not present

## 2024-01-03 DIAGNOSIS — F1120 Opioid dependence, uncomplicated: Secondary | ICD-10-CM | POA: Diagnosis not present

## 2024-01-03 DIAGNOSIS — Z79899 Other long term (current) drug therapy: Secondary | ICD-10-CM | POA: Diagnosis not present

## 2024-01-03 DIAGNOSIS — E7800 Pure hypercholesterolemia, unspecified: Secondary | ICD-10-CM | POA: Diagnosis not present

## 2024-01-03 DIAGNOSIS — F17210 Nicotine dependence, cigarettes, uncomplicated: Secondary | ICD-10-CM | POA: Diagnosis not present

## 2024-01-03 DIAGNOSIS — Z6826 Body mass index (BMI) 26.0-26.9, adult: Secondary | ICD-10-CM | POA: Diagnosis not present

## 2024-01-04 ENCOUNTER — Other Ambulatory Visit: Payer: Self-pay | Admitting: Neurosurgery

## 2024-01-04 DIAGNOSIS — M4802 Spinal stenosis, cervical region: Secondary | ICD-10-CM

## 2024-01-11 DIAGNOSIS — I272 Pulmonary hypertension, unspecified: Secondary | ICD-10-CM | POA: Diagnosis not present

## 2024-01-11 DIAGNOSIS — I361 Nonrheumatic tricuspid (valve) insufficiency: Secondary | ICD-10-CM | POA: Diagnosis not present
